# Patient Record
Sex: Female | Born: 1975 | Race: White | Hispanic: No | Marital: Married | Smoking: Never smoker
Health system: Southern US, Community
[De-identification: ages and names within clinical notes are randomized; demographics above are authoritative.]

## PROBLEM LIST (undated history)

## (undated) DIAGNOSIS — B001 Herpesviral vesicular dermatitis: Secondary | ICD-10-CM

## (undated) DIAGNOSIS — E669 Obesity, unspecified: Secondary | ICD-10-CM

## (undated) DIAGNOSIS — Z9889 Other specified postprocedural states: Secondary | ICD-10-CM

## (undated) DIAGNOSIS — E785 Hyperlipidemia, unspecified: Secondary | ICD-10-CM

## (undated) DIAGNOSIS — N393 Stress incontinence (female) (male): Secondary | ICD-10-CM

## (undated) DIAGNOSIS — R112 Nausea with vomiting, unspecified: Secondary | ICD-10-CM

## (undated) DIAGNOSIS — N920 Excessive and frequent menstruation with regular cycle: Secondary | ICD-10-CM

## (undated) HISTORY — PX: GASTROPLASTY DUODENAL SWITCH: SHX1699

## (undated) HISTORY — DX: Herpesviral vesicular dermatitis: B00.1

## (undated) HISTORY — DX: Obesity, unspecified: E66.9

## (undated) HISTORY — DX: Hyperlipidemia, unspecified: E78.5

---
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
## 1997-03-17 HISTORY — PX: LAPAROSCOPIC CHOLECYSTECTOMY: SUR755

## 2000-08-11 ENCOUNTER — Other Ambulatory Visit: Admission: RE | Admit: 2000-08-11 | Discharge: 2000-08-11 | Payer: Self-pay | Admitting: Obstetrics and Gynecology

## 2001-08-19 ENCOUNTER — Other Ambulatory Visit: Admission: RE | Admit: 2001-08-19 | Discharge: 2001-08-19 | Payer: Self-pay | Admitting: Obstetrics and Gynecology

## 2002-06-30 ENCOUNTER — Encounter: Payer: Self-pay | Admitting: Family Medicine

## 2002-06-30 ENCOUNTER — Ambulatory Visit (HOSPITAL_COMMUNITY): Admission: RE | Admit: 2002-06-30 | Discharge: 2002-06-30 | Payer: Self-pay | Admitting: Family Medicine

## 2002-08-30 ENCOUNTER — Other Ambulatory Visit: Admission: RE | Admit: 2002-08-30 | Discharge: 2002-08-30 | Payer: Self-pay | Admitting: Obstetrics and Gynecology

## 2002-11-04 ENCOUNTER — Ambulatory Visit (HOSPITAL_COMMUNITY): Admission: RE | Admit: 2002-11-04 | Discharge: 2002-11-04 | Payer: Self-pay | Admitting: Obstetrics and Gynecology

## 2002-11-04 ENCOUNTER — Encounter (INDEPENDENT_AMBULATORY_CARE_PROVIDER_SITE_OTHER): Payer: Self-pay

## 2002-11-04 HISTORY — PX: OTHER SURGICAL HISTORY: SHX169

## 2003-10-10 ENCOUNTER — Other Ambulatory Visit: Admission: RE | Admit: 2003-10-10 | Discharge: 2003-10-10 | Payer: Self-pay | Admitting: Obstetrics and Gynecology

## 2004-03-17 HISTORY — PX: TUBAL LIGATION: SHX77

## 2004-09-19 ENCOUNTER — Other Ambulatory Visit: Admission: RE | Admit: 2004-09-19 | Discharge: 2004-09-19 | Payer: Self-pay | Admitting: Obstetrics and Gynecology

## 2004-11-22 ENCOUNTER — Inpatient Hospital Stay (HOSPITAL_COMMUNITY): Admission: AD | Admit: 2004-11-22 | Discharge: 2004-11-22 | Payer: Self-pay | Admitting: Obstetrics and Gynecology

## 2004-12-13 ENCOUNTER — Inpatient Hospital Stay (HOSPITAL_COMMUNITY): Admission: AD | Admit: 2004-12-13 | Discharge: 2004-12-13 | Payer: Self-pay | Admitting: Obstetrics & Gynecology

## 2005-01-28 ENCOUNTER — Inpatient Hospital Stay (HOSPITAL_COMMUNITY): Admission: AD | Admit: 2005-01-28 | Discharge: 2005-02-05 | Payer: Self-pay | Admitting: Obstetrics and Gynecology

## 2005-02-18 ENCOUNTER — Ambulatory Visit: Payer: Self-pay | Admitting: *Deleted

## 2005-02-25 ENCOUNTER — Ambulatory Visit: Payer: Self-pay | Admitting: *Deleted

## 2005-03-04 ENCOUNTER — Ambulatory Visit: Payer: Self-pay | Admitting: *Deleted

## 2005-03-04 ENCOUNTER — Ambulatory Visit (HOSPITAL_COMMUNITY): Admission: RE | Admit: 2005-03-04 | Discharge: 2005-03-04 | Payer: Self-pay | Admitting: Obstetrics and Gynecology

## 2005-03-08 ENCOUNTER — Inpatient Hospital Stay (HOSPITAL_COMMUNITY): Admission: AD | Admit: 2005-03-08 | Discharge: 2005-03-13 | Payer: Self-pay | Admitting: Obstetrics and Gynecology

## 2005-03-09 ENCOUNTER — Encounter (INDEPENDENT_AMBULATORY_CARE_PROVIDER_SITE_OTHER): Payer: Self-pay | Admitting: Specialist

## 2005-04-24 ENCOUNTER — Other Ambulatory Visit: Admission: RE | Admit: 2005-04-24 | Discharge: 2005-04-24 | Payer: Self-pay | Admitting: Obstetrics and Gynecology

## 2008-12-05 ENCOUNTER — Emergency Department (HOSPITAL_COMMUNITY): Admission: EM | Admit: 2008-12-05 | Discharge: 2008-12-05 | Payer: Self-pay | Admitting: Emergency Medicine

## 2010-08-02 NOTE — Discharge Summary (Signed)
NAMEALNISA, [name]              ACCOUNT NO.:  [id number]   MEDICAL RECORD NO.:  [id number]          PATIENT TYPE:  INP   LOCATION:  9319                          FACILITY:  [facility]   PHYSICIAN:  Dineen Kid. Rana Snare, M.D.    DATE OF BIRTH:  1975-07-17   DATE OF ADMISSION:  03/08/2005  DATE OF DISCHARGE:  03/13/2005                                 DISCHARGE SUMMARY   ADMITTING DIAGNOSIS:  1.  Intrauterine pregnancy at 33-1/2 weeks' estimated gestational age.  2.  Twin gestation with breech/vertex presentation.   DISCHARGE DIAGNOSES:  1.  Status post low transverse cesarean section.  2.  Viable twin infants female and female.  3.  Permanent sterilization.   PROCEDURE:  1.  Primary low transverse cesarean section.  2.  Bilateral tubal ligation.   REASON FOR ADMISSION:  Please see written H&P.   HOSPITAL COURSE:  The patient is a 35 year old gravida 5, para 3 that was  admitted to [facility] at 33-1/2 weeks' estimated  gestational age with twin gestation, currently on terbutaline pump.  The  patient had presented to [facility] with increasing uterine  contractions.  Upon cervical exam, the cervix was dilated to 3 cm, 75%  effaced with twin A in the breech presentation.  The patient was contracting  regularly and was now admitted for management of preterm labor.  The patient  had been in the hospital previously and had received betamethasone.  On  admission, vital signs were stable.  Fetal heart tones were in the 150s  which were reactive.  Contractions were approximately every 2-4 minutes  without deceleration.  Due to a history of group B beta strep, IV  antibiotics were also administered and magnesium sulfate.  On the following  morning the patient had sustained spontaneous rupture of membranes on twin  A.  Fluid was noted to be clear. Cervix was now 5 cm.  The fetal heart tones  were in the 150s.  Decision was made to proceed with a primary low  transverse cesarean section.  The patient was then transferred to the  operating room where a spinal anesthesia was administered without  difficulty.  A low transverse incision was made with the delivery of a  viable twin A female infant, weighing 4 pounds 7 ounces and Apgars of 8 at one  minute and 9 at five minutes.  Arterial cord pH was 7.33.  Twin B female  weighing 4 pounds 1 ounce with Apgars of 9 at one minute and 9 at five  minutes.  Arterial cord pH was 7.33.  Bilateral tubal ligation was performed  without difficulty.  The patient tolerated the procedure well and was taken  to the recovery room in stable condition.  On postoperative day #1, the  patient was without complaint.  Vital signs were stable.  Babies was doing  well in the NICU.  Vital signs were stable.  She was afebrile.  The  abdominal dressing was noted to be clean, dry and intact.  Laboratory  findings revealed hemoglobin of 9.1.  On postoperative day #2, the patient  was without complaint.  She was tolerating a regular diet without complaints  of nausea or vomiting.  She was ambulating well.  Vital signs remained  stable.  She was afebrile.  Abdominal dressing had been removed revealing an  incision that is clean, dry and intact.  On postoperative day #3, the  patient was without complaint.  Vital signs were stable.  Abdomen soft.  Fundus firm and nontender.  Incision was clean, dry and intact. On  postoperative day #4, the patient was without complaint.  Babies remained  stable in the NICU.  Both were on room air with gavage feedings.  Abdomen  soft.  Fundus firm and nontender.  Incision was clean, dry and intact.  Subsequently, the patient was discharged home.   CONDITION ON DISCHARGE:  Good.   DIET:  Regular as tolerated.   ACTIVITY:  No heavy lifting, no driving x2 weeks, no vaginal entry.   FOLLOWUP:  The patient is to follow up in the office in 7-10 days for an  incision check.  She is to call for  temperature greater than 100 degrees,  persistent nausea and vomiting, heavy vaginal bleeding and/or redness or  drainage from incisional site.   DISCHARGE MEDICATIONS:  1.  Percocet 5/325, #30, on p.o. q.4-6h. p.r.n.  2.  Motrin 600 mg every 6 hours p.r.n.  3.  Valtrex 500 mg one p.o. daily.  4.  Prenatal vitamins one p.o. daily.      Julio Sicks, N.P.      Dineen Kid Rana Snare, M.D.  Electronically Signed    CC/MEDQ  D:  04/07/2005  T:  04/08/2005  Job:  [id number]

## 2010-08-02 NOTE — Discharge Summary (Signed)
[name], [name]              ACCOUNT NO.:  [id number]   MEDICAL RECORD NO.:  [id number]          PATIENT TYPE:  INP   LOCATION:  9162                          FACILITY:  [facility]   PHYSICIAN:  Michelle L. Grewal, M.D.DATE OF BIRTH:  [birth year]   DATE OF ADMISSION:  01/28/2005  DATE OF DISCHARGE:  02/05/2005                                 DISCHARGE SUMMARY   ADMITTING DIAGNOSES:  1.  Intrauterine pregnancy at 28-1/7 weeks' estimated gestational age.  2.  Twin gestation.  3.  Preterm labor.   DISCHARGE DIAGNOSES:  1.  Intrauterine pregnancy at 29-1/7 weeks; estimated gestational age.  2.  Preterm labor, tocolysis.   REASON FOR ADMISSION:  Please see written H&P.   HOSPITAL COURSE:  The patient is 35 year old gravida 5, para 3 that was  admitted to [facility] at 281/7 weeks with a twin gestation  who was sent to admission to triage for complaints of decreased fetal  movement.  Upon monitoring, the patient was noted to have contractions and  cervical change to 1 cm, 50% effaced with vertex at -3 station. The patient  was admitted for tocolysis of her labor.  The patient did have a history of  group B beta strep with a previous pregnancy, and IV antibiotics were  started.  Ultrasound was performed which revealed vertex and transverse lie.  Biophysical profile was 8/8  x2, and normal amniotic fluid index was noted.  Vital signs were stable.  She was afebrile.  Fetal heart tones were in the  140-150s, which were reactive.  The patient was admitted and started on  magnesium sulfate for tocolysis of her labor.  Betamethasone was  administered for enhancement of fetal lung maturity, and IV antibiotics were  started for history of positive group B beta strep.  On the following  morning, the patient continued on magnesium sulfate and IV antibiotics.  Fetal heart tones were reactive in the 140s.  The patient was now noted to  be without contractions.  The patient was  weaned from the magnesium sulfate  and was started on terbutaline.  _____ was contacted for precertification  for subcu terbutaline pump.  On the following day, the patient was without  complaint.  Fetal heart tones were within normal limits.  Uterine  contractions were noted to be irregular.  The patient was now on terbutaline  every 6 hours, and decision due to increasing uterine irritability was to  increase the p.o. terbutaline to 2.5 every 2-6 hours.  On the following  morning, the patient did noted that there was some increased contraction  pattern throughout the night.  Fetal heart tones continued to be reactive.  Abdomen soft.  Cervix was dilated 2 cm, 40%, soft with small parts palpable  through the membranes. The  patient continued on bedrest again awaiting for  the terbutaline pump.  The patient continued on oral terbutaline with  questionable restarting of the magnesium sulfate.  After several more days  without observation for terbutaline pump, the patient was now restarted on  magnesium sulfate.  Bag of waters continued to be intact.  A contraction  pattern did arrest with the usage of magnesium sulfate.  On February 05, 2005, the patient was without complaint.  We will continue to wait on the  terbutaline pump.  Vital signs were stable.  She was afebrile.  Fetal heart  tones were reactive both twin A and B.  No contractions were noted.  The  patient was later discharged that day to home on bedrest on oral  terbutaline, 5 mg every 4 hours   CONDITION ON DISCHARGE:  Stable.   DIET:  Regular as tolerated.   ACTIVITY:  Bed rest with bathroom privileges only.   FOLLOWUP:  The patient is to follow up in the office in one week for fetal  monitoring and cervical exam.  She is to call for menstrual-like cramping,  increasing in pelvic pressure, spontaneous rupture of membranes, low dull  backache unrelieved by heat or Tylenol or decrease in fetal movement.   DISCHARGE  MEDICATIONS:  1.  Terbutaline 5 mg one p.o. every 4 hours.  2.  Prenatal vitamins one p.o. daily.  3.  Colace one p.o. daily p.r.n.      Julio Sicks, N.P.      Stann Mainland. Vincente Poli, M.D.  Electronically Signed    CC/MEDQ  D:  04/07/2005  T:  04/08/2005  Job:  [id number]

## 2010-08-02 NOTE — H&P (Signed)
NAME:  Brandy, Mitchell                          ACCOUNT NO.:  [id number]   MEDICAL RECORD NO.:  [id number]                   PATIENT TYPE:   LOCATION:                                       FACILITY:   PHYSICIAN:  Guy Sandifer. Arleta Creek, M.D.           DATE OF BIRTH:   DATE OF ADMISSION:  11/04/2002  DATE OF DISCHARGE:                                HISTORY & PHYSICAL   CHIEF COMPLAINT:  Heavy painful menses.   HISTORY OF PRESENT ILLNESS:  This patient is a 35 year old married white  female, G4, P3, Ab 1, on the Ortho-Evra patch who has had significantly  increased  premenstrual cramping. The pain has progressed to the point that  the patient requests further evaluation in view of the failure of hormonal  contraceptives to adequately relieve her pain. She is also having heavier  menses each month.   An ultrasound in my office on September 06, 2002, revealed a uterus measuring  8.63 x 3.90 x 5.35 cm. A sonohistogram was positive for a thickening of the  posterior wall of the endometrial cavity, possibly consistent with an  endometrial polyp. A laparoscopy hysteroscopy with resectoscope dilatation  and curettage has been discussed with the patient preoperatively. The  potential risks and complications have also been reviewed preoperatively.   PAST MEDICAL HISTORY:  Stress urinary incontinence.   PAST SURGICAL HISTORY:  1. Laparoscopic cholecystectomy in 1999.  2. Dilatation and curettage in 1995.   MEDICATIONS:  1. Zithromax.  2. Lucienne Minks.   ALLERGIES:  No known drug allergies.   SOCIAL HISTORY:  The patient denies tobacco, alcohol or drug abuse.   FAMILY HISTORY:  Positive for breast cancer in maternal grandmother.  Prostate cancer in paternal grandfather. Chronic  hypertension in mother.  Alzheimer's disease in maternal great grandmother.   REVIEW OF SYSTEMS:  NEUROLOGIC:  Denies  headache. PULMONARY: The patient  was recently treated for  sinusitis which is getting better on  Zithromax.  CARDIAC:  The patient denies chest pain. GI: Denies changes in bowel habits.   PHYSICAL EXAMINATION:  GENERAL:  Height 4 feet, 9 inches. Weight 178-1/2  pounds.  VITAL SIGNS:  Blood pressure 116/76.  HEENT:  Without thyromegaly.  LUNGS:  Clear to auscultation.  HEART:  Regular rate and rhythm.  BACK:  No CVA tenderness.  BREASTS:  No masses, tract or discharge.  ABDOMEN:  Obese, soft, nontender without masses.  PELVIC:  Vulva, vagina, cervix without lesions. Uterus is normal in size and  nontender. Adnexa nontender  without masses.  EXTREMITIES:  Grossly  within normal limits.  NEUROLOGIC:  Grossly  within normal limits.   ASSESSMENT:  Dysmenorrhea and menorrhagia.   PLAN:  Laparoscopy, hysteroscopy with dilatation and curettage.  Guy Sandifer Arleta Creek, M.D.    JET/MEDQ  D:  11/01/2002  T:  11/01/2002  Job:  [id number]

## 2010-08-02 NOTE — Op Note (Signed)
[name], [name]              ACCOUNT NO.:  [id number]   MEDICAL RECORD NO.:  [id number]           PATIENT TYPE:   LOCATION:                                 FACILITY:   PHYSICIAN:  Juluis Mire, M.D.        DATE OF BIRTH:   DATE OF PROCEDURE:  DATE OF DISCHARGE:                                 OPERATIVE REPORT   PREOPERATIVE DIAGNOSES:  1.  Twin pregnancy at 33-1/2 weeks.  2.  Breech vertex presentation.  3.  Spontaneous rupture of membranes with progressive cervical dilatation.  4.  Multiparity. Desires sterility.   POSTOPERATIVE DIAGNOSES:  1.  Twin pregnancy at 33-1/2 weeks.  2.  Breech vertex presentation.  3.  Spontaneous rupture of membranes with progressive cervical dilatation.  4.  Multiparity. Desires sterility.   OPERATION/PROCEDURE:  1.  Low transverse cesarean section.  2.  Bilateral tubal ligation.   SURGEON:  Juluis Mire, M.D.   ANESTHESIA:  Spinal.   ESTIMATED BLOOD LOSS:  500-600 mL.   PACKS AND DRAINS:  None.   INTRAOPERATIVE BLOOD REPLACED:  None.   COMPLICATIONS:  None.   INDICATIONS:  The patient is a 35 year old female who is at 33-1/2 weeks  with twin pregnancy.  She presented with progressive contractions despite a  terbutaline pump.  Was subsequently placed on IV magnesium sulfate with  stabilization of the contraction pattern.  She had previously been given a  bag of Methasone.  She was placed on Unasyn.  Cervix was noted to be 3, 75%  effaced. She subsequently had spontaneous rupture of membranes with  progressive cervical dilatation.  Twin A was in a breech presentation.  The  decision was to proceed with primary cesarean section.  The risks were  discussed including risk of infection, risk of the hemorrhage that could  require transfusion with the risk of AIDS or hepatitis, risk of injury to  adjacent organs including bladder, bowel and ureters which could require  further exploratory surgery, the risk of deep venous thrombosis  and  pulmonary embolus.  She also desires a permanent sterilization.  The  potential reversibility of sterilization is explained.  The failure rate of  1 in 200 was quoted.  Failure can in the form of ectopic pregnancy requiring  further surgical management.   DESCRIPTION OF PROCEDURE:  The patient was taken to the OR and placed in the  supine position, left lateral tilt.  After satisfactory level of spinal  anesthesia was obtained, the abdomen was prepped out with Betadine and  draped with sterile field.  A low transverse skin incision was with the  knife and carried through the subcutaneous tissue.  Anterior rectus fascia  was entered sharply.  Incision in the fascia was extended laterally.  Fascia  taken off the muscles superiorly and inferiorly.  Rectus muscles were  separated in the midline.  Peritoneum was entered sharply and incision in  the peritoneum extended both superiorly and inferiorly.  A low transverse  bladder flap was developed.  A low transverse uterine incision begun with  the knife and extended laterally  using manual traction.  Twin A presented in  the breech presentation.  It was delivered in the usual manner.  Twin A was  a viable female who weight 4 pounds 7 ounces.  Apgars 8 and 9.  Umbilical cord  PH was 7.33.  Subsequently twin B was delivered after artificial rupture of  membranes.  Fluid was clear.  Infant was in the vertex presentation.  Delivered with elevation of the head and fundal pressure.  The infant was a  viable female weighing 4 pounds 1 ounce.  Apgars were 8 and 9.  Umbilicus  artery PH was also 7.33.  Placenta was delivered manually.  Uterus was wiped  free of membranes and placenta.  Uterus was then closed with interlocking  suture of 0 chromic using a two-layer closure technique, brought under  control with figure-of-eights of 0 chromic.   Both tubes were identified and elevated with the Babcock tenaculum.  A hole  was made in avascular area of the  mesosalpinx.  Individual ligatures of 0  plain catgut used to ligate off the segment of tube.  The intervening  segment of tube was then excised.  Cut end of the tube was then cauterized  with good hemostasis bilaterally.  Ovaries were unremarkable.   Uterus was returned to the abdominal cavity.  We thoroughly irrigated.  Had  good hemostasis and clear urine output.  Muscles reapproximated with running  suture of 3-0 Vicryl.  Fascia was closed with running suture of 0 PDS.  Skin  was closed with staples and Steri-Strips.  Sponge, instrument and needle  counts were reported as correct by the circulating nurse x2.  Foley catheter  remained clear at time of closure.  The patient tolerated the procedure well  and was returned to the recovery room in good condition.      Juluis Mire, M.D.  Electronically Signed     JSM/MEDQ  D:  03/09/2005  T:  03/10/2005  Job:  [id number]

## 2010-08-02 NOTE — Op Note (Signed)
[name], [name]                        ACCOUNT NO.:  [id number]   MEDICAL RECORD NO.:  [id number]                   PATIENT TYPE:  AMB   LOCATION:  SDC                                  FACILITY:  [facility]   PHYSICIAN:  Guy Sandifer. Arleta Creek, M.D.           DATE OF BIRTH:  Oct 29, 1975   DATE OF PROCEDURE:  11/04/2002  DATE OF DISCHARGE:                                 OPERATIVE REPORT   PREOPERATIVE DIAGNOSIS:  1. Menorrhagia.  2. Dysmenorrhea.   POSTOPERATIVE DIAGNOSIS:  1. Menorrhagia.  2. Pelvic adhesions.   PROCEDURE:  Laparoscopy with lysis of adhesions and hysteroscopy with  dilation and curettage.   SURGEON:  Guy Sandifer. Henderson Cloud, M.D.   ANESTHESIA:  General with endotracheal intubation, Cristela Blue, M.D.   ESTIMATED BLOOD LOSS:  Less than 50 mL.   INPUTS AND OUTPUTS:  Sorbitol distending media, 30 mL deficit.   SPECIMENS:  Endometrial curettings.   INDICATIONS AND CONSENT:  The patient is a 35 year old married white female,  G4, P3, who has increasing premenstrual cramping as well as heavy vaginal  bleeding.  Details are dictated in the history and physical.  Hysteroscopy  with possible resectoscope, dilation and curettage, laparoscopy has been  discussed with the patient.  The potential risks and complications have been  discussed preoperatively including but not limited to infection, uterine  perforation, bowel, bladder or ureteral damage, bleeding requiring  transfusion of blood products with possible transfusion reaction, HIV,  hepatitis acquisition, DVT, PE, pneumonia, laparotomy and hysterectomy.  All  questions have been answered and consent is signed on the chart.   FINDINGS:  Endometrial canal is without abnormal structure.  Fallopian tube  ostia are identified bilaterally.  The upper abdomen is grossly normal.  The  appendix is normal.  The uterus is upper limits of normal in size and normal  in contour.  Anterior and posterior cul-de-sacs and pelvic  sidewalls are  normal.  Tubes and ovaries are normal bilaterally.  The left ovary is  adherent to the sigmoid with filmy adhesions at its distal pole.   PROCEDURE:  The patient was taken to the operating room and placed in the  dorsal supine position where general anesthesia was induced with  endotracheal intubation.  She was then placed in the dorsal lithotomy  position where she was prepped abdominally and vaginally.  Her bladder was  straight catheterized and she is draped in a sterile fashion.  A bivalve  speculum was placed in the vagina, the anterior cervical lip is injected  with 1% Xylocaine and grasped with a single tooth tenaculum.  A paracervical  block was then placed at the 2, 4, 5, 7, 8, and 10 o'clock positions with  approximately 20 mL total 1% Xylocaine.  The cervix is gently progressively  dilated to 25 Pratt dilator.  The diagnostic hysteroscope is placed and  advanced under direct visualization using Sorbitol distending media.  The  findings are noted, the hysteroscope was withdrawn.  Sharp curettage was  carried out.  Reinspection with the hysteroscope reveals the cavity to be  clean.  The single tooth tenaculum is replaced with a  Hulka tenaculum and  attention was turned to the abdomen.  A small infraumbilical incision was  made and a 10/11 disposable trocar sleeve was placed on the first attempt  without difficulty.  Placement was verified with the laparoscope and careful  inspection reveals no damage to the surrounding structures was noted.  Pneumoperitoneum was induced.  A small suprapubic incision was made and a 5  mm disposable trocar sleeve was placed under direct visualization without  difficulty.  The above findings were noted.  The adhesions to the left ovary  are taken down sharply.  The suprapubic trocar sleeve was removed,  pneumoperitoneum was reduced, and good hemostasis was noted all around.  The  pneumoperitoneum is completely reduced.  Umbilical  trocar sleeve is removed.  A 2-0 Vicryl suture was used to close the subcutaneous tissues in the  umbilical incision.  Both incisions were injected with 0.5% plain Marcaine.  Dermabond was used to close the skin incisions.  The Hulka tenaculum was  removed and good hemostasis was noted.  All counts were correct.  The  patient was awakened and taken to the recovery room in stable condition.  She did receive preoperative antibiotics.                                               Guy Sandifer Arleta Creek, M.D.    JET/MEDQ  D:  11/04/2002  T:  11/04/2002  Job:  [id number]

## 2012-07-20 ENCOUNTER — Telehealth: Payer: Self-pay | Admitting: Family Medicine

## 2012-07-20 NOTE — Telephone Encounter (Signed)
Need shot records for children, [name], and [name] for new school they are going to.  Need by end of week.

## 2012-07-20 NOTE — Telephone Encounter (Signed)
Mother to call Dr. Webb Laws office to get shot record.  Never sent to [place] by Halm's office and not in Epic. BB

## 2013-02-02 ENCOUNTER — Ambulatory Visit (HOSPITAL_COMMUNITY): Admission: RE | Admit: 2013-02-02 | Payer: 59 | Source: Ambulatory Visit

## 2013-02-02 ENCOUNTER — Ambulatory Visit (INDEPENDENT_AMBULATORY_CARE_PROVIDER_SITE_OTHER): Payer: 59 | Admitting: Nurse Practitioner

## 2013-02-02 ENCOUNTER — Ambulatory Visit (HOSPITAL_COMMUNITY)
Admission: RE | Admit: 2013-02-02 | Discharge: 2013-02-02 | Disposition: A | Discharge status: Home / Self Care | Payer: 59 | Source: Ambulatory Visit | Attending: Nurse Practitioner | Admitting: Nurse Practitioner

## 2013-02-02 ENCOUNTER — Encounter: Payer: Self-pay | Admitting: Nurse Practitioner

## 2013-02-02 VITALS — BP 110/68 | Ht <= 58 in | Wt 234.0 lb

## 2013-02-02 DIAGNOSIS — M799 Soft tissue disorder, unspecified: Secondary | ICD-10-CM | POA: Insufficient documentation

## 2013-02-02 DIAGNOSIS — M25569 Pain in unspecified knee: Secondary | ICD-10-CM

## 2013-02-02 DIAGNOSIS — M25562 Pain in left knee: Secondary | ICD-10-CM

## 2013-02-02 MED ORDER — CEPHALEXIN 500 MG PO CAPS: 500.0000 mg | ORAL_CAPSULE | Freq: Three times a day (TID) | ORAL | Status: AC

## 2013-02-03 ENCOUNTER — Encounter: Payer: Self-pay | Admitting: Nurse Practitioner

## 2013-02-03 ENCOUNTER — Telehealth: Payer: Self-pay | Admitting: Nurse Practitioner

## 2013-02-03 ENCOUNTER — Other Ambulatory Visit: Payer: Self-pay | Admitting: *Deleted

## 2013-02-03 NOTE — Progress Notes (Signed)
Spoke with patient and told her US results: no clot; possible bruise ( hematoma) or infection. D dimer normal. As a precaution, will call in 7 days of antibiotics. Warm compresses. NSAIDs for pain. Call back if worsens or persists. Pt verbalized understanding. 

## 2013-02-03 NOTE — Progress Notes (Signed)
Subjective:  Presents with complaints of a bump in the left pretibial area for the past few days. Slight swelling in the lower leg at times. No specific history of injury. No fever. No chest pain shortness of breath. [facility] her mother has had both legs removed due to blood clots, note she is a smoker. No other family members have a history of DVT. Patient is not a smoker.  Objective:   BP 110/68  Ht 4\' 9"  (1.448 m)  Wt 234 lb (106.142 kg)  BMI 50.62 kg/m2 NAD. Alert, oriented. Lungs clear. Heart regular rate rhythm. A firm fixed nodule noted in the mid left pretibial area with minimal discoloration. Faint erythema noted around this. Noted at the tip of a previous scar. Lower extremity minimal edema. Strong DP pulse. Toes warm with good capillary refill.  Assessment:Pain in joint, lower leg, left - Plan: D-dimer, quantitative, [place] Extrem Low Left Ltd, [place] Extrem Low Left Ltd, [place] Extrem Low Left Ltd, CANCELED: US Venous Img Lower Unilateral Left  Plan: Further followup based on test results. Warm compresses. Continue ibuprofen for pain.

## 2013-02-03 NOTE — Telephone Encounter (Signed)
Spoke with patient and told her [place] results: no clot; possible bruise ( hematoma) or infection. D dimer normal. As a precaution, will call in 7 days of antibiotics. Warm compresses. NSAIDs for pain. Call back if worsens or persists. Pt verbalized understanding.

## 2013-02-03 NOTE — Telephone Encounter (Signed)
Patient called to inquire about Test Results

## 2014-01-04 ENCOUNTER — Other Ambulatory Visit: Payer: Self-pay | Admitting: Nurse Practitioner

## 2014-01-04 ENCOUNTER — Telehealth: Payer: Self-pay | Admitting: Family Medicine

## 2014-01-04 MED ORDER — IVERMECTIN 0.5 % EX LOTN: TOPICAL_LOTION | CUTANEOUS | Status: DC

## 2014-01-04 NOTE — Telephone Encounter (Signed)
Patient notified

## 2014-01-04 NOTE — Telephone Encounter (Signed)
Yes will call in [name]. Also tea tree oil is very effective and also once resolved, add a few drops to shampoo during bathing to help prevent reinfection.

## 2014-01-04 NOTE — Telephone Encounter (Signed)
Mom called yesterday to get med for girls whom acquired lice from school Now mom has an an pretty sure dad does too, can we call something in for both  Of them or for the rest of the family. (which is a total of 5 people)   [place]

## 2014-03-22 ENCOUNTER — Ambulatory Visit (INDEPENDENT_AMBULATORY_CARE_PROVIDER_SITE_OTHER): Payer: 59 | Admitting: Family Medicine

## 2014-03-22 VITALS — Temp 98.0°F

## 2014-03-22 DIAGNOSIS — B9689 Other specified bacterial agents as the cause of diseases classified elsewhere: Secondary | ICD-10-CM

## 2014-03-22 DIAGNOSIS — J019 Acute sinusitis, unspecified: Secondary | ICD-10-CM

## 2014-03-22 MED ORDER — LEVOFLOXACIN 500 MG PO TABS: 500.0000 mg | ORAL_TABLET | Freq: Every day | ORAL | Status: DC

## 2014-03-22 NOTE — Patient Instructions (Signed)
Schedule nurse visit for both ears to be irrigated

## 2014-03-22 NOTE — Progress Notes (Signed)
   Subjective:    Patient ID: [name], female    DOB: Nov 29, 1975, 39 y.o.   MRN: [id number]  HPI Patient relates she started off with a viral illness then several days later started having a lot of sinus pressure pain discomfort teeth aching not feeling good low-grade fevers denies any neck stiffness denies any wheezing or difficulty breathing. PMH benign family member sick as well   Review of Systems  Constitutional: Negative for fever and activity change.  HENT: Positive for congestion and rhinorrhea. Negative for ear pain.   Eyes: Negative for discharge.  Respiratory: Positive for cough. Negative for shortness of breath and wheezing.   Cardiovascular: Negative for chest pain.       Objective:   Physical Exam  Constitutional: She appears well-developed.  HENT:  Head: Normocephalic.  Nose: Nose normal.  Mouth/Throat: Oropharynx is clear and moist. No oropharyngeal exudate.  Neck: Neck supple.  Cardiovascular: Normal rate and normal heart sounds.   No murmur heard. Pulmonary/Chest: Effort normal and breath sounds normal. She has no wheezes.  Lymphadenopathy:    She has no cervical adenopathy.  Skin: Skin is warm and dry.  Nursing note and vitals reviewed.         Assessment & Plan:  Viral syndrome Acute bacterial sinusitis Antibiotics prescribed Warning signs discussed.

## 2014-04-17 ENCOUNTER — Telehealth: Payer: Self-pay | Admitting: Family Medicine

## 2014-04-17 MED ORDER — SULFACETAMIDE SODIUM 10 % OP SOLN: OPHTHALMIC | Status: DC

## 2014-04-17 NOTE — Telephone Encounter (Signed)
Sulfacetamide drops sent to pharmacy per protocol. Left message on voicemail notifying patient.

## 2014-04-17 NOTE — Telephone Encounter (Signed)
Pt is having redness to the eyes, gritty feeling, itchy   No sulfa allergies,   [facility] [facility]

## 2014-06-29 ENCOUNTER — Other Ambulatory Visit: Payer: Self-pay | Admitting: Obstetrics and Gynecology

## 2014-06-30 LAB — CYTOLOGY - PAP

## 2014-08-16 ENCOUNTER — Encounter (HOSPITAL_BASED_OUTPATIENT_CLINIC_OR_DEPARTMENT_OTHER): Payer: Self-pay | Admitting: *Deleted

## 2014-08-16 NOTE — Progress Notes (Signed)
NPO AFTER MN.  ARRIVE AT 0930.  NEEDS CBC AND T & S.

## 2014-08-22 NOTE — H&P (Signed)
[name] is an 39 y.o. female G5P5 S/P BTL with GSUI and menorrhagia. U/S negative for intracavitary uterine masses. 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 Gyn Oncology recommends fractional D&C. Also urodynamics C/W GSUI that is clinically becoming worse.  Pertinent Gynecological History: Menses: flow is excessive with use of many pads or tampons on heaviest days Bleeding: dysfunctional uterine bleeding Contraception: tubal ligation DES exposure: denies Blood transfusions: none Sexually transmitted diseases: no past history Previous GYN Procedures: BTL  Last mammogram: none Date: N/A Last pap: normal Date: 2016 OB History: G5, P5   Menstrual History: Menarche age: unknown  Patient's last menstrual period was 07/16/2014 (exact date).    Past Medical History  Diagnosis Date  . PONV (postoperative nausea and vomiting)   . SUI (stress urinary incontinence, female)   . Menorrhagia     Past Surgical History  Procedure Laterality Date  . Dilation and curettage of uterus  1995  . Laparoscopic cholecystectomy  1999  . Dx  laparosocpy with lysis adhesions/  d & c hysteroscopy  11-04-2002  . Cesarean section  03-09-2005    W/  BILATERAL TUBAL LIGATION    History reviewed. No pertinent family history.  Social History:  reports that she has never smoked. She has never used smokeless tobacco. She reports that she does not drink alcohol or use illicit drugs.  Allergies: Not on File  No prescriptions prior to admission    ROS  Height 4' 10.25" (1.48 m), weight 223 lb (101.152 kg), last menstrual period 07/16/2014. Physical Exam  No results found for this or any previous visit (from the past 24 hour(s)).  No results found.  Assessment/Plan: 39 yo G5P5 S/P BTL with menorrhagia and EMB showing squamous morular metaplasia and GSUI D/W patient fractional D&C, hysteroscopy, TOT Risks reviewed including infection, organ damage,  bleeding/transfusion-HIV/Hep, DVT/PE, pneumonia, pelvic pain, painful intercourse, groin/leg pain, recurrent or persistent SUI, prolonged catheterization, return to OR, overactive bladder, erosion of sling.  All questions answered. Patient states she understands and agrees.  Demarkis Gheen II,Ramah Langhans E 08/22/2014, 4:23 PM

## 2014-08-23 ENCOUNTER — Ambulatory Visit (HOSPITAL_BASED_OUTPATIENT_CLINIC_OR_DEPARTMENT_OTHER): Payer: 59 | Admitting: Anesthesiology

## 2014-08-23 ENCOUNTER — Ambulatory Visit (HOSPITAL_BASED_OUTPATIENT_CLINIC_OR_DEPARTMENT_OTHER)
Admission: RE | Admit: 2014-08-23 | Discharge: 2014-08-23 | Disposition: A | Discharge status: Home / Self Care | Payer: 59 | Source: Ambulatory Visit | Attending: Obstetrics and Gynecology | Admitting: Obstetrics and Gynecology

## 2014-08-23 ENCOUNTER — Encounter (HOSPITAL_BASED_OUTPATIENT_CLINIC_OR_DEPARTMENT_OTHER): Payer: Self-pay

## 2014-08-23 ENCOUNTER — Encounter (HOSPITAL_BASED_OUTPATIENT_CLINIC_OR_DEPARTMENT_OTHER)
Admission: RE | Disposition: A | Discharge status: Home / Self Care | Payer: Self-pay | Source: Ambulatory Visit | Attending: Obstetrics and Gynecology

## 2014-08-23 DIAGNOSIS — Z9851 Tubal ligation status: Secondary | ICD-10-CM | POA: Diagnosis not present

## 2014-08-23 DIAGNOSIS — N920 Excessive and frequent menstruation with regular cycle: Secondary | ICD-10-CM | POA: Insufficient documentation

## 2014-08-23 DIAGNOSIS — N393 Stress incontinence (female) (male): Secondary | ICD-10-CM | POA: Diagnosis not present

## 2014-08-23 DIAGNOSIS — N841 Polyp of cervix uteri: Secondary | ICD-10-CM | POA: Diagnosis not present

## 2014-08-23 DIAGNOSIS — Z6841 Body Mass Index (BMI) 40.0 and over, adult: Secondary | ICD-10-CM | POA: Insufficient documentation

## 2014-08-23 DIAGNOSIS — Z9049 Acquired absence of other specified parts of digestive tract: Secondary | ICD-10-CM | POA: Diagnosis not present

## 2014-08-23 HISTORY — DX: Stress incontinence (female) (male): N39.3

## 2014-08-23 HISTORY — PX: HYSTEROSCOPY WITH D & C: SHX1775

## 2014-08-23 HISTORY — DX: Nausea with vomiting, unspecified: R11.2

## 2014-08-23 HISTORY — DX: Excessive and frequent menstruation with regular cycle: N92.0

## 2014-08-23 HISTORY — DX: Other specified postprocedural states: Z98.890

## 2014-08-23 HISTORY — PX: PUBOVAGINAL SLING: SHX1035

## 2014-08-23 LAB — CBC
HCT: 42 % (ref 36.0–46.0)
Hemoglobin: 14 g/dL (ref 12.0–15.0)
MCH: 29.3 pg (ref 26.0–34.0)
MCHC: 33.3 g/dL (ref 30.0–36.0)
MCV: 87.9 fL (ref 78.0–100.0)
PLATELETS: 283 10*3/uL (ref 150–400)
RBC: 4.78 MIL/uL (ref 3.87–5.11)
RDW: 12.5 % (ref 11.5–15.5)
WBC: 5.5 10*3/uL (ref 4.0–10.5)

## 2014-08-23 LAB — TYPE AND SCREEN
ABO/RH(D): O POS
Antibody Screen: NEGATIVE

## 2014-08-23 LAB — ABO/RH: ABO/RH(D): O POS

## 2014-08-23 SURGERY — DILATATION AND CURETTAGE /HYSTEROSCOPY
Anesthesia: General | Site: Vagina

## 2014-08-23 MED ORDER — BUPIVACAINE HCL (PF) 0.25 % IJ SOLN
INTRAMUSCULAR | Status: DC | PRN
  Administered 2014-08-23: 18 mL

## 2014-08-23 MED ORDER — PROPOFOL 10 MG/ML IV BOLUS
INTRAVENOUS | Status: DC | PRN
  Administered 2014-08-23: 200 mg via INTRAVENOUS

## 2014-08-23 MED ORDER — GLYCOPYRROLATE 0.2 MG/ML IJ SOLN
INTRAMUSCULAR | Status: DC | PRN
  Administered 2014-08-23: 0.2 mg via INTRAVENOUS

## 2014-08-23 MED ORDER — GLYCINE 1.5 % IR SOLN
Status: DC | PRN
  Administered 2014-08-23: 3000 mL

## 2014-08-23 MED ORDER — FENTANYL CITRATE (PF) 100 MCG/2ML IJ SOLN
25.0000 ug | INTRAMUSCULAR | Status: DC | PRN
  Administered 2014-08-23: 50 ug via INTRAVENOUS
  Filled 2014-08-23: qty 1

## 2014-08-23 MED ORDER — FENTANYL CITRATE (PF) 100 MCG/2ML IJ SOLN
INTRAMUSCULAR | Status: AC
  Filled 2014-08-23: qty 4

## 2014-08-23 MED ORDER — CEFAZOLIN SODIUM-DEXTROSE 2-3 GM-% IV SOLR
2.0000 g | INTRAVENOUS | Status: AC
  Administered 2014-08-23: 2 g via INTRAVENOUS
  Filled 2014-08-23: qty 50

## 2014-08-23 MED ORDER — STERILE WATER FOR IRRIGATION IR SOLN
Status: DC | PRN
  Administered 2014-08-23: 3000 mL

## 2014-08-23 MED ORDER — FENTANYL CITRATE (PF) 100 MCG/2ML IJ SOLN
INTRAMUSCULAR | Status: AC
  Filled 2014-08-23: qty 2

## 2014-08-23 MED ORDER — DEXAMETHASONE SODIUM PHOSPHATE 10 MG/ML IJ SOLN
INTRAMUSCULAR | Status: DC | PRN
  Administered 2014-08-23: 10 mg via INTRAVENOUS

## 2014-08-23 MED ORDER — KETOROLAC TROMETHAMINE 30 MG/ML IJ SOLN
INTRAMUSCULAR | Status: DC | PRN
  Administered 2014-08-23: 30 mg via INTRAVENOUS

## 2014-08-23 MED ORDER — CEFAZOLIN SODIUM-DEXTROSE 2-3 GM-% IV SOLR
INTRAVENOUS | Status: AC
  Filled 2014-08-23: qty 50

## 2014-08-23 MED ORDER — PROMETHAZINE HCL 25 MG/ML IJ SOLN
6.2500 mg | INTRAMUSCULAR | Status: DC | PRN
  Filled 2014-08-23: qty 1

## 2014-08-23 MED ORDER — MIDAZOLAM HCL 5 MG/5ML IJ SOLN
INTRAMUSCULAR | Status: DC | PRN
  Administered 2014-08-23 (×2): 1 mg via INTRAVENOUS

## 2014-08-23 MED ORDER — LIDOCAINE HCL (CARDIAC) 20 MG/ML IV SOLN
INTRAVENOUS | Status: DC | PRN
  Administered 2014-08-23: 100 mg via INTRAVENOUS

## 2014-08-23 MED ORDER — LACTATED RINGERS IV SOLN
INTRAVENOUS | Status: DC
  Administered 2014-08-23 (×3): via INTRAVENOUS
  Filled 2014-08-23: qty 1000

## 2014-08-23 MED ORDER — ONDANSETRON HCL 4 MG/2ML IJ SOLN
INTRAMUSCULAR | Status: DC | PRN
  Administered 2014-08-23: 4 mg via INTRAVENOUS

## 2014-08-23 MED ORDER — FENTANYL CITRATE (PF) 100 MCG/2ML IJ SOLN
INTRAMUSCULAR | Status: DC | PRN
  Administered 2014-08-23 (×8): 25 ug via INTRAVENOUS

## 2014-08-23 MED ORDER — MIDAZOLAM HCL 2 MG/2ML IJ SOLN
INTRAMUSCULAR | Status: AC
  Filled 2014-08-23: qty 4

## 2014-08-23 MED ORDER — LACTATED RINGERS IV SOLN
INTRAVENOUS | Status: DC
  Filled 2014-08-23: qty 1000

## 2014-08-23 MED ORDER — ACETAMINOPHEN 10 MG/ML IV SOLN
INTRAVENOUS | Status: DC | PRN
  Administered 2014-08-23: 1000 mg via INTRAVENOUS

## 2014-08-23 MED ORDER — MEPERIDINE HCL 25 MG/ML IJ SOLN
6.2500 mg | INTRAMUSCULAR | Status: DC | PRN
  Filled 2014-08-23: qty 1

## 2014-08-23 MED ORDER — METOCLOPRAMIDE HCL 5 MG/ML IJ SOLN
INTRAMUSCULAR | Status: DC | PRN
  Administered 2014-08-23: 10 mg via INTRAVENOUS

## 2014-08-23 SURGICAL SUPPLY — 56 items
BLADE SURG 11 STRL SS (BLADE) ×4 IMPLANT
BLADE SURG 15 STRL LF DISP TIS (BLADE) ×2 IMPLANT
BLADE SURG 15 STRL SS (BLADE) ×4
CATH ROBINSON RED A/P 16FR (CATHETERS) ×2 IMPLANT
COVER BACK TABLE 60X90IN (DRAPES) ×6 IMPLANT
COVER LIGHT HANDLE  1/PK (MISCELLANEOUS)
COVER LIGHT HANDLE 1/PK (MISCELLANEOUS) ×2 IMPLANT
COVER MAYO STAND STRL (DRAPES) ×4 IMPLANT
DECANTER SPIKE VIAL GLASS SM (MISCELLANEOUS) IMPLANT
DEVICE CAPIO SLIM SINGLE (INSTRUMENTS) IMPLANT
DEVICE MYOSURE CLASSIC (MISCELLANEOUS) IMPLANT
DEVICE MYOSURE LITE (MISCELLANEOUS) IMPLANT
DRAPE HYSTEROSCOPY (DRAPE) ×4 IMPLANT
DRAPE LG THREE QUARTER DISP (DRAPES) ×8 IMPLANT
DRSG TELFA 3X8 NADH (GAUZE/BANDAGES/DRESSINGS) ×4 IMPLANT
ELECT REM PT RETURN 9FT ADLT (ELECTROSURGICAL) ×4
ELECTRODE REM PT RTRN 9FT ADLT (ELECTROSURGICAL) ×2 IMPLANT
FILTER ARTHROSCOPY CONVERTOR (FILTER) ×2 IMPLANT
GLOVE BIO SURGEON STRL SZ 6.5 (GLOVE) ×1 IMPLANT
GLOVE BIO SURGEON STRL SZ7.5 (GLOVE) ×8 IMPLANT
GLOVE BIO SURGEONS STRL SZ 6.5 (GLOVE) ×1
GLOVE BIOGEL PI IND STRL 8 (GLOVE) ×4 IMPLANT
GLOVE BIOGEL PI INDICATOR 8 (GLOVE) ×4
GLOVE INDICATOR 6.5 STRL GRN (GLOVE) ×2 IMPLANT
GOWN STRL REUS W/TWL LRG LVL3 (GOWN DISPOSABLE) ×8 IMPLANT
LEGGING LITHOTOMY PAIR STRL (DRAPES) ×4 IMPLANT
LIQUID BAND (GAUZE/BANDAGES/DRESSINGS) ×4 IMPLANT
NDL SPNL 22GX3.5 QUINCKE BK (NEEDLE) ×2 IMPLANT
NEEDLE HYPO 22GX1.5 SAFETY (NEEDLE) IMPLANT
NEEDLE SPNL 22GX3.5 QUINCKE BK (NEEDLE) ×4 IMPLANT
NS IRRIG 500ML POUR BTL (IV SOLUTION) ×4 IMPLANT
PACK BASIN DAY SURGERY FS (CUSTOM PROCEDURE TRAY) ×4 IMPLANT
PAD DRESSING TELFA 3X8 NADH (GAUZE/BANDAGES/DRESSINGS) ×2 IMPLANT
PAD OB MATERNITY 4.3X12.25 (PERSONAL CARE ITEMS) ×4 IMPLANT
PAD PREP 24X48 CUFFED NSTRL (MISCELLANEOUS) ×4 IMPLANT
PENCIL BUTTON HOLSTER BLD 10FT (ELECTRODE) ×4 IMPLANT
PLUG CATH AND CAP STER (CATHETERS) ×4 IMPLANT
SEAL ROD LENS SCOPE MYOSURE (ABLATOR) ×2 IMPLANT
SET IRRIG Y TYPE TUR BLADDER L (SET/KITS/TRAYS/PACK) ×4 IMPLANT
SLING HALO OBTRYX (Sling) ×4 IMPLANT
SLING HALO OBTRYX NDL (Sling) IMPLANT
SUT CAPIO POLYGLYCOLIC (SUTURE) IMPLANT
SUT VIC AB 2-0 CT1 27 (SUTURE) ×4
SUT VIC AB 2-0 CT1 TAPERPNT 27 (SUTURE) ×2 IMPLANT
SYR BULB IRRIGATION 50ML (SYRINGE) ×4 IMPLANT
SYR CONTROL 10ML LL (SYRINGE) ×4 IMPLANT
SYRINGE 10CC LL (SYRINGE) ×4 IMPLANT
TOWEL OR 17X24 6PK STRL BLUE (TOWEL DISPOSABLE) ×8 IMPLANT
TRAY DSU PREP LF (CUSTOM PROCEDURE TRAY) ×6 IMPLANT
TRAY FOLEY CATH SILVER 14FR (SET/KITS/TRAYS/PACK) ×4 IMPLANT
TUBE CONNECTING 12'X1/4 (SUCTIONS) ×1
TUBE CONNECTING 12X1/4 (SUCTIONS) ×3 IMPLANT
TUBING AQUILEX INFLOW (TUBING) ×4 IMPLANT
TUBING AQUILEX OUTFLOW (TUBING) ×4 IMPLANT
WATER STERILE IRR 500ML POUR (IV SOLUTION) ×4 IMPLANT
YANKAUER SUCT BULB TIP NO VENT (SUCTIONS) ×4 IMPLANT

## 2014-08-23 NOTE — Anesthesia Preprocedure Evaluation (Signed)
Anesthesia Evaluation  Patient identified by MRN, date of birth, ID band Patient awake    Reviewed: Allergy & Precautions, NPO status , Patient's Chart, lab work & pertinent test results  History of Anesthesia Complications (+) PONV  Airway Mallampati: II  TM Distance: >3 FB Neck ROM: Full    Dental no notable dental hx.    Pulmonary neg pulmonary ROS,  breath sounds clear to auscultation  Pulmonary exam normal       Cardiovascular negative cardio ROS Normal cardiovascular examRhythm:Regular Rate:Normal     Neuro/Psych negative neurological ROS  negative psych ROS   GI/Hepatic negative GI ROS, Neg liver ROS,   Endo/Other  negative endocrine ROSMorbid obesity  Renal/GU negative Renal ROS  negative genitourinary   Musculoskeletal negative musculoskeletal ROS (+)   Abdominal   Peds negative pediatric ROS (+)  Hematology negative hematology ROS (+)   Anesthesia Other Findings   Reproductive/Obstetrics negative OB ROS                             Anesthesia Physical Anesthesia Plan  ASA: II  Anesthesia Plan: General   Post-op Pain Management:    Induction: Intravenous  Airway Management Planned: LMA  Additional Equipment:   Intra-op Plan:   Post-operative Plan: Extubation in OR  Informed Consent: I have reviewed the patients History and Physical, chart, labs and discussed the procedure including the risks, benefits and alternatives for the proposed anesthesia with the patient or authorized representative who has indicated his/her understanding and acceptance.   Dental advisory given  Plan Discussed with: CRNA  Anesthesia Plan Comments:         Anesthesia Quick Evaluation

## 2014-08-23 NOTE — Progress Notes (Signed)
No changes to H&P per patient history Reviewed with patient procedure-Fractional D&C, hysteroscopy, TOT All questions answered Patient states she understands and agrees

## 2014-08-23 NOTE — Transfer of Care (Signed)
Immediate Anesthesia Transfer of Care Note  Patient: [name]  Procedure(s) Performed: Procedure(s) (LRB): DILATATION AND CURETTAGE /HYSTEROSCOPY WITH ECC (N/A) TRANSOBTURATOR SLING (N/A)  Patient Location: PACU  Anesthesia Type: General  Level of Consciousness: awake, sedated, patient cooperative and responds to stimulation  Airway & Oxygen Therapy: Patient Spontanous Breathing and Patient connected to face mask oxygen  Post-op Assessment: Report given to PACU RN, Post -op Vital signs reviewed and stable and Patient moving all extremities  Post vital signs: Reviewed and stable  Complications: No apparent anesthesia complications

## 2014-08-23 NOTE — Progress Notes (Signed)
08/23/2014  12:03 PM  PATIENT:  [name]  39 y.o. female  PRE-OPERATIVE DIAGNOSIS:  MENRORHAGIA, SUI  POST-OPERATIVE DIAGNOSIS:  MENRORHAGIA, SU  PROCEDURE:  Procedure(s): DILATATION AND CURETTAGE /HYSTEROSCOPY WITH ECC (N/A) TRANSOBTURATOR SLING (N/A)  SURGEON:  Surgeon(s) and Role:    * Everlene Farrier, MD - Primary  PHYSICIAN ASSISTANT:   ASSISTANTS: none   ANESTHESIA:   general  EBL:  Total I/O In: 1000 [I.V.:1000] Out: 100 [Blood:100]  BLOOD ADMINISTERED:none  DRAINS: Urinary Catheter (Foley)   LOCAL MEDICATIONS USED:  MARCAINE    and LIDOCAINE   SPECIMEN:  Source of Specimen:  endocervical currettage, endometrial currettage  DISPOSITION OF SPECIMEN:  PATHOLOGY  COUNTS:  YES  TOURNIQUET:  * No tourniquets in log *  DICTATION: .Other Dictation: Dictation Number [id number]  PLAN OF CARE: Discharge to home after PACU  PATIENT DISPOSITION:  PACU - hemodynamically stable.   Delay start of Pharmacological VTE agent (>24hrs) due to surgical blood loss or risk of bleeding: not applicable

## 2014-08-23 NOTE — Anesthesia Postprocedure Evaluation (Signed)
  Anesthesia Post-op Note  Patient: [name]  Procedure(s) Performed: Procedure(s) (LRB): DILATATION AND CURETTAGE /HYSTEROSCOPY WITH ECC (N/A) TRANSOBTURATOR SLING (N/A)  Patient Location: PACU  Anesthesia Type: General  Level of Consciousness: awake and alert   Airway and Oxygen Therapy: Patient Spontanous Breathing  Post-op Pain: mild  Post-op Assessment: Post-op Vital signs reviewed, Patient's Cardiovascular Status Stable, Respiratory Function Stable, Patent Airway and No signs of Nausea or vomiting  Last Vitals:  Filed Vitals:   08/23/14 1220  BP:   Pulse: 120  Temp:   Resp: 17    Post-op Vital Signs: stable   Complications: No apparent anesthesia complications

## 2014-08-23 NOTE — Anesthesia Procedure Notes (Signed)
Procedure Name: LMA Insertion Date/Time: 08/23/2014 11:03 AM Performed by: Justice Rocher Pre-anesthesia Checklist: Patient identified, Emergency Drugs available, Suction available and Patient being monitored Patient Re-evaluated:Patient Re-evaluated prior to inductionOxygen Delivery Method: Circle System Utilized Preoxygenation: Pre-oxygenation with 100% oxygen Intubation Type: IV induction Ventilation: Mask ventilation without difficulty LMA: LMA inserted LMA Size: 4.0 Number of attempts: 1 Airway Equipment and Method: Bite block Placement Confirmation: positive ETCO2 Tube secured with: Tape Dental Injury: Teeth and Oropharynx as per pre-operative assessment

## 2014-08-23 NOTE — Discharge Instructions (Addendum)
Post Anesthesia Home Care Instructions  Activity: Get plenty of rest for the remainder of the day. A responsible adult should stay with you for 24 hours following the procedure.  For the next 24 hours, DO NOT: -Drive a car -Paediatric nurse -Drink alcoholic beverages -Take any medication unless instructed by your physician -Make any legal decisions or sign important papers.  Meals: Start with liquid foods such as gelatin or soup. Progress to regular foods as tolerated. Avoid greasy, spicy, heavy foods. If nausea and/or vomiting occur, drink only clear liquids until the nausea and/or vomiting subsides. Call your physician if vomiting continues.  Special Instructions/Symptoms: Your throat may feel dry or sore from the anesthesia or the breathing tube placed in your throat during surgery. If this causes discomfort, gargle with warm salt water. The discomfort should disappear within 24 hours.  If you had a scopolamine patch placed behind your ear for the management of post- operative nausea and/or vomiting:  1. The medication in the patch is effective for 72 hours, after which it should be removed.  Wrap patch in a tissue and discard in the trash. Wash hands thoroughly with soap and water. 2. You may remove the patch earlier than 72 hours if you experience unpleasant side effects which may include dry mouth, dizziness or visual disturbances. 3. Avoid touching the patch. Wash your hands with soap and water after contact with the patch.     Home care Instructions:   Personal hygiene:  Used sanitary napkins for vaginal drainage not tampons. Shower or tub bathe the day after your procedure. No douching until bleeding stops. Always wipe from front to back after  Elimination.  Activity: Do not drive or operate any equipment today. The effects of the anesthesia are still present and drowsiness may result. Rest today, not necessarily flat bed rest, just take it easy. You may resume your normal  activity in one to 2 days.  Sexual activity: No intercourse for one week or as indicated by your physician  Diet: Eat a light diet as desired this evening. You may resume a regular diet tomorrow.  Return to work: One to 2 days.  General Expectations of your surgery: Vaginal bleeding should be no heavier than a normal period. Spotting may continue up to 10 days. Mild cramps may continue for a couple of days. You may have a regular period in 2-6 weeks.  Unexpected observations call your doctor if these occur: persistent or heavy bleeding. Severe abdominal cramping or pain. Elevation of temperature greater than 100F.   HOME CARE INSTRUCTIONS FOR SLING  Activity:  -No lifting greater than 10-15 pounds for 1 week, or as instructed by your    physician.             -No sexual intercourse until your f/u visit Diet:  You may return to your normal diet tomorrow.   It is important to keep your   bowels regular during the postoperative period.  To avoid constipation, drink plenty of fluids during the day (8-10 glasses) and eat plenty of fresh fruits and vegetables.  Use a mild laxative or stool softener if necessary.  Wound Care:  You may begin showering tomorrow, or as instructed by your physician.      Return to Work as instructed by your physician.    Special Instructions:   Call your physician if any of these symptoms occur:   -temperature greater than 101 degrees Farenheit.   -redness, swelling or drainage at incision site.   -  foul odor of your urine.   -a significant decrease in the amount of urine you have every day.   -severe pain not relieved by your pain medication.    Return to see your doctor in am   Patient Signature:  ________________________________________________________  Nurse's Signature:  ________________________________________________________

## 2014-08-24 ENCOUNTER — Encounter (HOSPITAL_BASED_OUTPATIENT_CLINIC_OR_DEPARTMENT_OTHER): Payer: Self-pay | Admitting: Obstetrics and Gynecology

## 2014-08-24 NOTE — Op Note (Signed)
[name], [name]              ACCOUNT NO.:  [id number]  MEDICAL RECORD NO.:  [id number]  LOCATION:                               FACILITY:  [facility]  PHYSICIAN:  Daleen Bo. Gaetano Net, M.D. DATE OF BIRTH:  09-26-1975  DATE OF PROCEDURE:  08/23/2014 DATE OF DISCHARGE:  08/23/2014                              OPERATIVE REPORT   PREOPERATIVE DIAGNOSES: 1. Menorrhagia. 2. Genuine stress urinary incontinence.  POSTOPERATIVE DIAGNOSES: 1. Menorrhagia. 2. Genuine stress urinary incontinence.  PROCEDURE: 1. Transobturator mid urethral sling. 2. Fractional dilation and curettage.  SURGEON:  Daleen Bo. Gaetano Net, M.D.  ANESTHESIA:  General with LMA.  ESTIMATED BLOOD LOSS:  Less than 100 mL.  I's and O's of distending media, 111 mL deficit.  SPECIMENS: 1. Endocervical curettage. 2. Endometrial curettage, both to Pathology.  INDICATIONS AND CONSENT:  This patient is a 39 year old, G5, P5, status post tubal ligation with heavy menses.  Ultrasound in the office was negative for endometrial masses.  Office endometrial biopsy was benign with a notation of squamous morular metaplasia.  Consultation with GYN/Oncology was done and they recommended fractional dilation and curettage.  She also has leaking with coughing and sneezing. Urodynamics studies have been consistent with genuine stress urinary continence.  After discussing options for the above, fractional D and C, hysteroscopy, and midurethral sling were reviewed with the patient. Potential risks and complications were reviewed preoperatively including, but not limited to, infection, uterine perforation, organ damage, bleeding requiring transfusion of blood products with HIV and hepatitis acquisition, DVT, PE, pneumonia, pelvic pain, abdominal pain, leg pain, painful intercourse, persistent or recurrent stress incontinence, erosion, prolonged catheterization, increased irritative voiding symptoms, return to the operating room.  All  questions have been answered.  The patient states she understands and agrees.  Consent was signed on the chart.  FINDINGS:  Endometrial cavities without abnormal masses.  Both ostia are identified.  DESCRIPTION OF PROCEDURE:  The patient was taken to the operating room, where she was identified, placed in the dorsal supine position, and general anesthesia was induced via LMA.  She was placed in dorsal lithotomy position.  She was prepped and draped in a sterile fashion. Time-out was undertaken.  Bivalve speculum was placed in the vagina and the anterior cervical lip was injected with 1% lidocaine and grasped with a single-tooth tenaculum.  Paracervical block at the 2, 4, 5, 7, 8, and 10 o'clock positions were done with approximately 20 mL of the same solution.  Endocervical curettage was carried out.  Gentle dilation of the endocervical os was carried out and sharp curettage of the endometrial cavity was done as well.  Diagnostic hysteroscope was then placed in the endocervical canal and advanced under direct visualization with distending media.  No abnormal structures were noted and both fallopian tube and ostia were identified.  Hysteroscope was withdrawn. Single-tooth tenaculum was removed.  Good hemostasis was noted and attention was turned to the midurethral sling.  The outer margin of the inferior pubic ramus just below the insertion of the adductor longus was identified and marked with a pen bilaterally.  This was then injected with 0.5% Marcaine with epinephrine.  The same solution was used to inject the  anterior vaginal mucosa below the course of the urethra.  A suburethral longitudinal midline incision was then made below the center of the urethra.  This was dissected bilaterally with Strully scissors toward the obturator.  Stab incision was then made bilaterally to allow passage of the Obtryx Halo needles.  They were then passed bilaterally with the tip of the needle guided  by the contralateral examining finger. The tips of both needles were exited out the suburethral incision. Inspection revealed no buttonholing bilaterally.  The Foley catheter was then removed and cystoscopy was carried out with a 70-degree scope.  A 360-degree inspection revealed no masses, no foreign bodies, and thorough examination revealed no evidence of perforation of the bladder. A good puff of urine was noted from the ureters bilaterally.  The cystoscope was removed.  Foley catheter was replaced.  The bladder was drained, and the Foley was left in place.  The Obtryx sling was then attached to the end of the needle, withdrawn bilaterally.  Care was taken to maintain a space between the urethra and the sling.  The sheath was then removed intact bilaterally.  Inspection revealed the sling to be lying flat with no rolls or kinks.  There was the width of the marking pen from the urethra to the sling providing a spacer.  The pen was removed.  The suburethral incision was closed in a running, locking fashion with 2-0 Vicryl.  Excess sling at the level of the skin was trimmed from the groin incisions bilaterally and both incisions were closed with glue.  Good hemostasis was noted.  The Foley was placed to straight drain.  All counts were correct.  The patient was awakened, taken to the recovery room in stable condition.     Daleen Bo Gaetano Net, M.D.     JET/MEDQ  D:  08/23/2014  T:  08/23/2014  Job:  [id number]

## 2014-09-01 ENCOUNTER — Encounter (HOSPITAL_BASED_OUTPATIENT_CLINIC_OR_DEPARTMENT_OTHER): Payer: Self-pay | Admitting: Obstetrics and Gynecology

## 2015-02-12 ENCOUNTER — Ambulatory Visit: Payer: BLUE CROSS/BLUE SHIELD | Admitting: Nurse Practitioner

## 2015-02-28 ENCOUNTER — Ambulatory Visit: Payer: BLUE CROSS/BLUE SHIELD | Admitting: Nurse Practitioner

## 2015-03-05 ENCOUNTER — Encounter: Payer: Self-pay | Admitting: Nurse Practitioner

## 2015-03-05 ENCOUNTER — Ambulatory Visit (INDEPENDENT_AMBULATORY_CARE_PROVIDER_SITE_OTHER): Payer: BLUE CROSS/BLUE SHIELD | Admitting: Nurse Practitioner

## 2015-03-05 VITALS — BP 100/70 | Ht <= 58 in | Wt 237.4 lb

## 2015-03-05 DIAGNOSIS — Z79899 Other long term (current) drug therapy: Secondary | ICD-10-CM | POA: Diagnosis not present

## 2015-03-05 DIAGNOSIS — R5383 Other fatigue: Secondary | ICD-10-CM

## 2015-03-05 DIAGNOSIS — Z13220 Encounter for screening for lipoid disorders: Secondary | ICD-10-CM

## 2015-03-05 DIAGNOSIS — N920 Excessive and frequent menstruation with regular cycle: Secondary | ICD-10-CM

## 2015-03-05 NOTE — Patient Instructions (Signed)
[facility] What will they cover?  Which provider?

## 2015-03-05 NOTE — Progress Notes (Deleted)
   Subjective:    Patient ID: [name], female    DOB: 1975-06-21, 39 y.o.   MRN: [id number]  HPI Waist circumference- 53 inches   Review of Systems     Objective:   Physical Exam        Assessment & Plan:

## 2015-03-07 ENCOUNTER — Encounter: Payer: Self-pay | Admitting: Nurse Practitioner

## 2015-03-07 LAB — BASIC METABOLIC PANEL
BUN / CREAT RATIO: 10 (ref 8–20)
BUN: 9 mg/dL (ref 6–20)
CO2: 22 mmol/L (ref 18–29)
CREATININE: 0.93 mg/dL (ref 0.57–1.00)
Calcium: 9.2 mg/dL (ref 8.7–10.2)
Chloride: 102 mmol/L (ref 96–106)
GFR calc Af Amer: 90 mL/min/{1.73_m2} (ref >59)
GFR, EST NON AFRICAN AMERICAN: 78 mL/min/{1.73_m2} (ref >59)
Glucose: 109 mg/dL — ABNORMAL HIGH (ref 65–99)
Potassium: 4.8 mmol/L (ref 3.5–5.2)
Sodium: 142 mmol/L (ref 134–144)

## 2015-03-07 LAB — CBC WITH DIFFERENTIAL/PLATELET
BASOS: 1 %
Basophils Absolute: 0 10*3/uL (ref 0.0–0.2)
EOS (ABSOLUTE): 0.1 10*3/uL (ref 0.0–0.4)
EOS: 2 %
HEMATOCRIT: 42.6 % (ref 34.0–46.6)
Hemoglobin: 13.9 g/dL (ref 11.1–15.9)
IMMATURE GRANULOCYTES: 0 %
Immature Grans (Abs): 0 10*3/uL (ref 0.0–0.1)
Lymphocytes Absolute: 2.2 10*3/uL (ref 0.7–3.1)
Lymphs: 39 %
MCH: 29.1 pg (ref 26.6–33.0)
MCHC: 32.6 g/dL (ref 31.5–35.7)
MCV: 89 fL (ref 79–97)
Monocytes Absolute: 0.3 10*3/uL (ref 0.1–0.9)
Monocytes: 6 %
NEUTROS PCT: 52 %
Neutrophils Absolute: 3 10*3/uL (ref 1.4–7.0)
Platelets: 314 10*3/uL (ref 150–379)
RBC: 4.78 x10E6/uL (ref 3.77–5.28)
RDW: 13.4 % (ref 12.3–15.4)
WBC: 5.7 10*3/uL (ref 3.4–10.8)

## 2015-03-07 LAB — TSH: TSH: 1.92 u[IU]/mL (ref 0.450–4.500)

## 2015-03-07 LAB — HEPATIC FUNCTION PANEL
ALBUMIN: 3.8 g/dL (ref 3.5–5.5)
ALT: 19 IU/L (ref 0–32)
AST: 10 IU/L (ref 0–40)
Alkaline Phosphatase: 65 IU/L (ref 39–117)
Bilirubin, Direct: 0.04 mg/dL (ref 0.00–0.40)
TOTAL PROTEIN: 6.5 g/dL (ref 6.0–8.5)

## 2015-03-07 LAB — LIPID PANEL
Chol/HDL Ratio: 3.7 ratio units (ref 0.0–4.4)
Cholesterol, Total: 221 mg/dL — ABNORMAL HIGH (ref 100–199)
HDL: 59 mg/dL (ref >39)
LDL Calculated: 126 mg/dL — ABNORMAL HIGH (ref 0–99)
Triglycerides: 180 mg/dL — ABNORMAL HIGH (ref 0–149)
VLDL Cholesterol Cal: 36 mg/dL (ref 5–40)

## 2015-03-07 NOTE — Progress Notes (Signed)
Subjective:  Presents to discuss weight loss. Has had difficulty losing weight over the past few years. Lost 23 pounds doing Weight Watchers last year but hit a plateau for several months. Was very active at that point going to the gym on a regular basis. Her mother died in 2022/10/30 of this year, gained back most of her weight. Plans to look into bariatric surgery, specifically the gastric sleeve.had a D&C in June, continues to have regular but heavy cycles. Sees gynecology for her physicals. Has not had her lab work done.  Objective:   BP 100/70 mmHg  Ht 4\' 10"  (1.473 m)  Wt 237 lb 6 oz (107.673 kg)  BMI 49.63 kg/m2 NAD. Alert, oriented. Lungs clear. Heart regular rate rhythm. Extreme central obesity noted with a waist circumference of 53 inches.  Assessment:  Problem List Items Addressed This Visit      Other   Menorrhagia - Primary    Other Visit Diagnoses    Morbid obesity, unspecified obesity type ([place])        Other fatigue        Relevant Orders    CBC with Differential/Platelet (Completed)    TSH (Completed)    Screening cholesterol level        Relevant Orders    Lipid panel (Completed)    High risk medication use        Relevant Orders    Hepatic function panel (Completed)    Basic metabolic panel (Completed)      Plan: Discussed options to help her with her weight loss. Increase activity. Discussed first steps in looking into bariatric surgery. Patient to let us know if we can be of assistance. Follow-up with gynecology regarding her cycles.

## 2015-03-15 ENCOUNTER — Telehealth: Payer: Self-pay | Admitting: Nurse Practitioner

## 2015-03-15 NOTE — Telephone Encounter (Signed)
Pt would like results please, try to call her after 2pm on Friday please

## 2015-03-20 ENCOUNTER — Encounter: Payer: Self-pay | Admitting: Family Medicine

## 2015-03-20 ENCOUNTER — Telehealth: Payer: Self-pay | Admitting: Nurse Practitioner

## 2015-03-20 NOTE — Telephone Encounter (Signed)
Patient is getting ready for weight loss surgery.  She wants to know if we can give her Rx for phentermine to help curb her appetite while she prepares so that she doesn't gain too much weight before hand..    [employer]

## 2015-03-20 NOTE — Telephone Encounter (Signed)
Pt is calling again today to request the results of her labs.

## 2015-03-22 ENCOUNTER — Encounter: Payer: Self-pay | Admitting: Nurse Practitioner

## 2015-03-22 DIAGNOSIS — R7301 Impaired fasting glucose: Secondary | ICD-10-CM | POA: Insufficient documentation

## 2015-03-22 NOTE — Telephone Encounter (Signed)
See message with labs

## 2015-03-22 NOTE — Telephone Encounter (Signed)
[facility]

## 2015-03-22 NOTE — Telephone Encounter (Signed)
Has she taken this before? If so, any side effects? How many months until her surgery?

## 2015-03-23 ENCOUNTER — Encounter: Payer: Self-pay | Admitting: Family Medicine

## 2015-03-23 ENCOUNTER — Other Ambulatory Visit: Payer: Self-pay | Admitting: Nurse Practitioner

## 2015-03-23 MED ORDER — PHENTERMINE HCL 37.5 MG PO TABS: 37.5000 mg | ORAL_TABLET | Freq: Every day | ORAL | Status: DC

## 2015-03-23 NOTE — Telephone Encounter (Signed)
Patient states she has taken this before and she had no side effects to this med. She states that she should be able to have her surgery in about 5 months.

## 2015-03-23 NOTE — Telephone Encounter (Signed)
Will send in 30 with 2 refills; will need to see her in 3 months if she wishes to continue

## 2015-03-23 NOTE — Telephone Encounter (Signed)
Left message on voicemail notifying patient that med sent in and to schedule appt in 3 months.

## 2015-03-30 ENCOUNTER — Other Ambulatory Visit: Payer: Self-pay | Admitting: Family Medicine

## 2015-04-02 ENCOUNTER — Ambulatory Visit: Payer: BLUE CROSS/BLUE SHIELD | Admitting: Nurse Practitioner

## 2015-04-02 DIAGNOSIS — Z029 Encounter for administrative examinations, unspecified: Secondary | ICD-10-CM

## 2015-04-10 ENCOUNTER — Ambulatory Visit (INDEPENDENT_AMBULATORY_CARE_PROVIDER_SITE_OTHER): Payer: BLUE CROSS/BLUE SHIELD | Admitting: Nurse Practitioner

## 2015-04-10 ENCOUNTER — Encounter: Payer: Self-pay | Admitting: Nurse Practitioner

## 2015-04-10 ENCOUNTER — Telehealth: Payer: Self-pay | Admitting: Family Medicine

## 2015-04-10 VITALS — BP 116/72 | Ht <= 58 in | Wt 237.2 lb

## 2015-04-10 DIAGNOSIS — R7301 Impaired fasting glucose: Secondary | ICD-10-CM

## 2015-04-10 NOTE — Telephone Encounter (Signed)
Rx prior auth APPROVED for pt's phentermine (ADIPEX-P) 37.5 MG tablet, vaild 04/05/15-07/04/15 through [facility], faxed approval to [facility]

## 2015-04-10 NOTE — Progress Notes (Signed)
Subjective:  Presents for recheck. Has contacted her insurance about bariatric surgery. Will need monthly visits for 6 months. This is the first of those visits. Plans to contact [facility] bariatric clinic to start process. Very active job, works at [employer]. Has a pedometer on her phone. Has taken 3 days of Phentermine without difficulty. Mainly dry mouth. Drinks very little in sugary beverages. Drinks sweet tea once a day about 4 days a week. Picky eater; limited vegetable intake. Does not eat breakfast. Craves salty high carb foods such as chips.   Objective:   BP 116/72 mmHg  Ht 4\' 10"  (1.473 m)  Wt 237 lb 4 oz (107.616 kg)  BMI 49.60 kg/m2 NAD. Alert, oriented. Significant central obesity noted. Lungs clear. Heart RRR.   Assessment:  Problem List Items Addressed This Visit      Endocrine   Elevated fasting glucose - Primary    Other Visit Diagnoses    Morbid obesity due to excess calories ([place])          Plan: set goals for next month which include Walking 10,000 or more steps per day Eating breakfast most days which can be a protein supplement. Start daily MVI Return in about 1 month (around 05/11/2015) for recheck.

## 2015-05-10 ENCOUNTER — Encounter: Payer: Self-pay | Admitting: Nurse Practitioner

## 2015-05-10 ENCOUNTER — Ambulatory Visit (INDEPENDENT_AMBULATORY_CARE_PROVIDER_SITE_OTHER): Payer: BLUE CROSS/BLUE SHIELD | Admitting: Nurse Practitioner

## 2015-05-10 VITALS — BP 120/76 | Ht <= 58 in | Wt 232.2 lb

## 2015-05-10 DIAGNOSIS — R7301 Impaired fasting glucose: Secondary | ICD-10-CM | POA: Diagnosis not present

## 2015-05-10 MED ORDER — PHENTERMINE HCL 37.5 MG PO TABS: 37.5000 mg | ORAL_TABLET | Freq: Every day | ORAL | Status: DC

## 2015-05-11 ENCOUNTER — Encounter: Payer: Self-pay | Admitting: Nurse Practitioner

## 2015-05-11 NOTE — Progress Notes (Signed)
Subjective:  Presents for routine follow-up. Patient's job has changed. A little less active than before. Trying to get in extra steps at least 3-4 days per week. Doing well eating breakfast. Taking daily multivitamin. Has been under increased stress. Taking phentermine without difficulty. No chest pain shortness of breath or palpitations.  Objective:   BP 120/76 mmHg  Ht 4\' 10"  (1.473 m)  Wt 232 lb 4 oz (105.348 kg)  BMI 48.55 kg/m2 NAD. Alert, oriented. Lungs clear. Heart regular rate rhythm. has lost 5 pounds since previous visit.  Assessment:  Problem List Items Addressed This Visit      Endocrine   Elevated fasting glucose - Primary     Other   Morbid obesity due to excess calories (HCC)   Relevant Medications   phentermine (ADIPEX-P) 37.5 MG tablet      Plan:  Meds ordered this encounter  Medications  . phentermine (ADIPEX-P) 37.5 MG tablet    Sig: Take 1 tablet (37.5 mg total) by mouth daily before breakfast.    Dispense:  30 tablet    Refill:  2    [facility]    Order Specific Question:  Supervising Provider    Answer:  Mikey Kirschner [[id number]]   Continue activity and weight loss efforts. Return in about 1 month (around 06/07/2015) for recheck.

## 2015-06-08 ENCOUNTER — Encounter: Payer: Self-pay | Admitting: Nurse Practitioner

## 2015-06-08 ENCOUNTER — Ambulatory Visit (INDEPENDENT_AMBULATORY_CARE_PROVIDER_SITE_OTHER): Payer: BLUE CROSS/BLUE SHIELD | Admitting: Nurse Practitioner

## 2015-06-08 VITALS — BP 110/70 | Ht <= 58 in | Wt 230.5 lb

## 2015-06-08 DIAGNOSIS — R7301 Impaired fasting glucose: Secondary | ICD-10-CM

## 2015-06-08 LAB — POCT GLYCOSYLATED HEMOGLOBIN (HGB A1C): HEMOGLOBIN A1C: 5.4

## 2015-06-08 MED ORDER — PHENTERMINE HCL 37.5 MG PO TABS: 37.5000 mg | ORAL_TABLET | Freq: Every day | ORAL | Status: DC

## 2015-06-09 ENCOUNTER — Encounter: Payer: Self-pay | Admitting: Nurse Practitioner

## 2015-06-09 NOTE — Progress Notes (Signed)
Subjective:  Presents for routine follow-up. Preparing for possible bariatric surgery. Still does well with her diet. Again less active on her new job. Misplaced her last prescription for phentermine, has not taken medication and about a month. Denies any adverse effects.  Objective:   BP 110/70 mmHg  Ht 4\' 10"  (1.473 m)  Wt 230 lb 8 oz (104.554 kg)  BMI 48.19 kg/m2 NAD. Alert, oriented. Lungs clear. Heart regular rate rhythm. Results for orders placed or performed in visit on 06/08/15  POCT HgB A1C  Result Value Ref Range   Hemoglobin A1C 5.4      Assessment:  Problem List Items Addressed This Visit      Endocrine   Elevated fasting glucose - Primary   Relevant Orders   POCT HgB A1C (Completed)     Other   Morbid obesity due to excess calories (HCC)   Relevant Medications   phentermine (ADIPEX-P) 37.5 MG tablet     Plan:  Meds ordered this encounter  Medications  . phentermine (ADIPEX-P) 37.5 MG tablet    Sig: Take 1 tablet (37.5 mg total) by mouth daily before breakfast.    Dispense:  30 tablet    Refill:  2    [facility]    Order Specific Question:  Supervising Provider    Answer:  Mikey Kirschner [[id number]]   Continue healthy diet and weight loss efforts. Restart phentermine as directed. Given information for [facility] bariatric clinic. Return in about 1 month (around 07/09/2015) for weight check.

## 2015-07-06 ENCOUNTER — Encounter: Payer: Self-pay | Admitting: Nurse Practitioner

## 2015-07-06 ENCOUNTER — Ambulatory Visit (INDEPENDENT_AMBULATORY_CARE_PROVIDER_SITE_OTHER): Payer: BLUE CROSS/BLUE SHIELD | Admitting: Nurse Practitioner

## 2015-07-06 VITALS — BP 120/80 | HR 88 | Ht <= 58 in | Wt 232.0 lb

## 2015-07-06 DIAGNOSIS — R7301 Impaired fasting glucose: Secondary | ICD-10-CM

## 2015-07-06 DIAGNOSIS — M6588 Other synovitis and tenosynovitis, other site: Secondary | ICD-10-CM | POA: Diagnosis not present

## 2015-07-06 DIAGNOSIS — M7752 Other enthesopathy of left foot: Secondary | ICD-10-CM

## 2015-07-06 NOTE — Progress Notes (Signed)
Subjective:  Presents for routine follow-up in preparation for bariatric surgery. Has increased her water intake. Stop drinking sodas. Still keeping up with her steps per day, her goal is 10,000 per day. Decided not to take phentermine but to make lifestyle changes. Also having some mild left ankle pain for the past 2 weeks. No specific history of injury. Wears good fitting shoes to work. Minimal edema.  Objective:   BP 120/80 mmHg  Pulse 88  Ht 4\' 10"  (1.473 m)  Wt 232 lb (105.235 kg)  BMI 48.50 kg/m2  SpO2 97%  LMP 06/06/2015 NAD. Alert, oriented. Lungs clear. Heart regular rate rhythm. Minimal change in weight since last visit. Left ankle mild tenderness around the posterior heel near the Achilles tendon. Normal ROM with minimal tenderness. No joint laxity. Upon standing significant pronation is noted in the left ankle.  Assessment:  Problem List Items Addressed This Visit      Endocrine   Elevated fasting glucose - Primary     Other   Morbid obesity due to excess calories ([place])    Other Visit Diagnoses    Left ankle tendinitis          Plan: Anti-inflammatories as directed for ankle pain. Neoprene ankle support. Call back if persists. Continue healthy lifestyle changes. Encourage patient to increase her activity since her new job is more sedentary. Return in about 1 month (around 08/05/2015) for recheck.

## 2015-08-03 ENCOUNTER — Ambulatory Visit (INDEPENDENT_AMBULATORY_CARE_PROVIDER_SITE_OTHER): Payer: BLUE CROSS/BLUE SHIELD | Admitting: Nurse Practitioner

## 2015-08-03 ENCOUNTER — Encounter: Payer: Self-pay | Admitting: Nurse Practitioner

## 2015-08-03 VITALS — BP 126/76 | Ht <= 58 in | Wt 238.2 lb

## 2015-08-03 DIAGNOSIS — R7301 Impaired fasting glucose: Secondary | ICD-10-CM | POA: Diagnosis not present

## 2015-08-03 NOTE — Progress Notes (Signed)
Subjective:  Presents for recheck on her weight. Plans to do the on line medially for the weight loss clinic. Eating small frequent meals. Walking 7000-8000 steps per day. Has gained weight lately but does not know why. Also having decreased hearing in the left ear. Minimal pain. No fever. Mild head congestion. No cough.  Objective:   BP 126/76 mmHg  Ht 4\' 10"  (1.473 m)  Wt 238 lb 4 oz (108.069 kg)  BMI 49.81 kg/m2  LMP 06/06/2015 NAD. Alert, oriented. TMs clear effusion left ear with minimal cerumen. Right ear normal limit. Pharynx clear. Neck supple with minimal adenopathy. Lungs clear. Heart regular rate rhythm. Hearing screen left ear normal.  Assessment:  Problem List Items Addressed This Visit      Endocrine   Elevated fasting glucose - Primary     Other   Morbid obesity due to excess calories (HCC)     Plan: OTC meds as directed for congestion. Encourage patient to continue regular walking and activity. Also substitute Premier protein for one of her meals or snacks to see if this will help. Return in about 1 month (around 09/03/2015) for recheck.

## 2015-08-03 NOTE — Patient Instructions (Addendum)
Premier protein Antihistamine Nasacort AQ as directed

## 2015-08-06 ENCOUNTER — Ambulatory Visit: Payer: BLUE CROSS/BLUE SHIELD | Admitting: Nurse Practitioner

## 2015-09-05 ENCOUNTER — Ambulatory Visit (INDEPENDENT_AMBULATORY_CARE_PROVIDER_SITE_OTHER): Payer: BLUE CROSS/BLUE SHIELD | Admitting: Nurse Practitioner

## 2015-09-05 ENCOUNTER — Encounter: Payer: Self-pay | Admitting: Nurse Practitioner

## 2015-09-05 VITALS — BP 128/82 | Ht <= 58 in | Wt 240.6 lb

## 2015-09-05 DIAGNOSIS — M62838 Other muscle spasm: Secondary | ICD-10-CM

## 2015-09-05 DIAGNOSIS — J300 Vasomotor rhinitis: Secondary | ICD-10-CM | POA: Diagnosis not present

## 2015-09-05 DIAGNOSIS — M6248 Contracture of muscle, other site: Secondary | ICD-10-CM

## 2015-09-05 NOTE — Patient Instructions (Signed)
OTC antihistamine Steroid nasal spray (Nasacort or Flonase)

## 2015-09-05 NOTE — Progress Notes (Signed)
Subjective:  Presents for routine follow-up for her monthly visit for bariatric surgery. Has maintained 12 walking program. Has started drinking protein shakes in between meals. Has decreased her serving portions. Also complaints of a headache off-and-on for the past couple weeks. Always occurs in the morning. Mainly around the frontal area behind the eyes. Describes as a pressure or a dull headache. Had a bad headache yesterday in the right frontal area which has resolved. No nausea vomiting. No visual changes. Also yesterday had some pain in the right neck area and tenderness along the right posterior scalp.  Objective:   BP 128/82 mmHg  Ht 4\' 10"  (1.473 m)  Wt 240 lb 9.6 oz (109.135 kg)  BMI 50.30 kg/m2 NAD. Alert, oriented. Lungs clear. Heart regular rate rhythm. TMs clear effusion, no erythema. Pharynx clear. Neck supple with minimal adenopathy. Lungs clear. Heart regular rate rhythm. Very tight tender muscles noted along the upper back and neck area on the right side.  Assessment:  Problem List Items Addressed This Visit      Other   Morbid obesity due to excess calories ([place])    Other Visit Diagnoses    Vasomotor rhinitis    -  Primary    Muscle spasms of head and/or neck            Plan: OTC antihistamine Steroid nasal spray (Nasacort or Flonase) Anti-inflammatories as directed. Stretching exercises. Massage to affected area on neck and back. Warning signs reviewed. Call back if headaches persist.

## 2015-12-17 ENCOUNTER — Encounter: Payer: Self-pay | Admitting: Family Medicine

## 2016-01-29 ENCOUNTER — Telehealth: Payer: Self-pay | Admitting: Family Medicine

## 2016-01-29 NOTE — Telephone Encounter (Signed)
Pt's having weight loss surgery at [facility] They need OV notes from 03/2015 - 08/2015 for her weight and documentation for weight loss efforts  They need to use this to get approval from insurance approval  Fax# [phone] ATTN: Montel Clock  Please call pt when done or if questions  1st appt there is 01/31/2016 if we could get info sent to them that would be great

## 2016-01-29 NOTE — Telephone Encounter (Signed)
Sure send

## 2016-02-05 ENCOUNTER — Other Ambulatory Visit (HOSPITAL_COMMUNITY): Payer: Self-pay | Admitting: Surgical Oncology

## 2016-02-15 ENCOUNTER — Other Ambulatory Visit (HOSPITAL_COMMUNITY): Payer: Self-pay | Admitting: Surgical Oncology

## 2016-02-15 ENCOUNTER — Ambulatory Visit (HOSPITAL_COMMUNITY)
Admission: RE | Admit: 2016-02-15 | Discharge: 2016-02-15 | Disposition: A | Discharge status: Home / Self Care | Payer: BLUE CROSS/BLUE SHIELD | Source: Ambulatory Visit | Attending: Surgical Oncology | Admitting: Surgical Oncology

## 2016-02-15 DIAGNOSIS — K430 Incisional hernia with obstruction, without gangrene: Secondary | ICD-10-CM | POA: Insufficient documentation

## 2016-02-15 DIAGNOSIS — I10 Essential (primary) hypertension: Secondary | ICD-10-CM | POA: Insufficient documentation

## 2016-02-15 DIAGNOSIS — E7800 Pure hypercholesterolemia, unspecified: Secondary | ICD-10-CM | POA: Insufficient documentation

## 2016-04-04 ENCOUNTER — Encounter: Payer: Self-pay | Admitting: Family Medicine

## 2016-04-04 ENCOUNTER — Ambulatory Visit (INDEPENDENT_AMBULATORY_CARE_PROVIDER_SITE_OTHER): Payer: BLUE CROSS/BLUE SHIELD | Admitting: Family Medicine

## 2016-04-04 VITALS — BP 128/82 | Temp 98.4°F | Wt 244.0 lb

## 2016-04-04 DIAGNOSIS — J029 Acute pharyngitis, unspecified: Secondary | ICD-10-CM | POA: Diagnosis not present

## 2016-04-04 DIAGNOSIS — R509 Fever, unspecified: Secondary | ICD-10-CM

## 2016-04-04 DIAGNOSIS — J111 Influenza due to unidentified influenza virus with other respiratory manifestations: Secondary | ICD-10-CM

## 2016-04-04 LAB — POCT RAPID STREP A (OFFICE): RAPID STREP A SCREEN: NEGATIVE

## 2016-04-04 NOTE — Progress Notes (Signed)
   Subjective:    Patient ID: [name], female    DOB: 09/25/1975, 41 y.o.   MRN: [id number]  HPI Patient in office today c/o throat pain, fever, and body aches.  Patient states her symptoms started Tuesday. Patient with some body aches headaches fatigue tiredness cough now with severe sore throat denies wheezing difficulty breathing denies sweats denies nausea vomiting diarrhea. PMH benign Review of Systems    see above please Objective:   Physical Exam  Lungs clear hearts regular neck no masses not toxic no sign of meningitis or pneumonia throat erythematous but not swollen rapid strep negative      Assessment & Plan:  Viral syndrome Probable flu Tamiflu would not be beneficial at this point If progressive measures or sickness occurred to notify us. Call us if any problems. Warning signs were discussed. Off work next several days.

## 2016-04-04 NOTE — Patient Instructions (Signed)

## 2016-04-06 LAB — CULTURE, GROUP A STREP: Strep A Culture: NEGATIVE

## 2016-09-21 ENCOUNTER — Emergency Department (HOSPITAL_COMMUNITY)
Admission: EM | Admit: 2016-09-21 | Discharge: 2016-09-21 | Disposition: A | Discharge status: Home / Self Care | Payer: BLUE CROSS/BLUE SHIELD | Attending: Emergency Medicine | Admitting: Emergency Medicine

## 2016-09-21 ENCOUNTER — Emergency Department (HOSPITAL_COMMUNITY): Payer: BLUE CROSS/BLUE SHIELD

## 2016-09-21 ENCOUNTER — Encounter (HOSPITAL_COMMUNITY): Payer: Self-pay | Admitting: *Deleted

## 2016-09-21 DIAGNOSIS — N132 Hydronephrosis with renal and ureteral calculous obstruction: Secondary | ICD-10-CM | POA: Insufficient documentation

## 2016-09-21 DIAGNOSIS — R1032 Left lower quadrant pain: Secondary | ICD-10-CM | POA: Diagnosis present

## 2016-09-21 DIAGNOSIS — N200 Calculus of kidney: Secondary | ICD-10-CM

## 2016-09-21 LAB — COMPREHENSIVE METABOLIC PANEL
ALBUMIN: 3.8 g/dL (ref 3.5–5.0)
ALT: 24 U/L (ref 14–54)
AST: 21 U/L (ref 15–41)
Alkaline Phosphatase: 78 U/L (ref 38–126)
Anion gap: 11 (ref 5–15)
BUN: 10 mg/dL (ref 6–20)
CHLORIDE: 112 mmol/L — AB (ref 101–111)
CO2: 20 mmol/L — AB (ref 22–32)
Calcium: 9.5 mg/dL (ref 8.9–10.3)
Creatinine, Ser: 1.11 mg/dL — ABNORMAL HIGH (ref 0.44–1.00)
GFR calc Af Amer: >60 mL/min (ref >60)
GFR calc non Af Amer: >60 mL/min (ref >60)
GLUCOSE: 175 mg/dL — AB (ref 65–99)
POTASSIUM: 3.6 mmol/L (ref 3.5–5.1)
Sodium: 143 mmol/L (ref 135–145)
Total Bilirubin: 0.9 mg/dL (ref 0.3–1.2)
Total Protein: 6.9 g/dL (ref 6.5–8.1)

## 2016-09-21 LAB — URINALYSIS, ROUTINE W REFLEX MICROSCOPIC
Bilirubin Urine: NEGATIVE
GLUCOSE, UA: NEGATIVE mg/dL
Ketones, ur: NEGATIVE mg/dL
Leukocytes, UA: NEGATIVE
Nitrite: NEGATIVE
PROTEIN: 100 mg/dL — AB
Specific Gravity, Urine: 1.027 (ref 1.005–1.030)
Squamous Epithelial / LPF: NONE SEEN
pH: 5 (ref 5.0–8.0)

## 2016-09-21 LAB — CBC WITH DIFFERENTIAL/PLATELET
BASOS ABS: 0 10*3/uL (ref 0.0–0.1)
BASOS PCT: 0 %
Eosinophils Absolute: 0.1 10*3/uL (ref 0.0–0.7)
Eosinophils Relative: 1 %
HEMATOCRIT: 40.4 % (ref 36.0–46.0)
Hemoglobin: 13.5 g/dL (ref 12.0–15.0)
Lymphocytes Relative: 16 %
Lymphs Abs: 1.9 10*3/uL (ref 0.7–4.0)
MCH: 28.6 pg (ref 26.0–34.0)
MCHC: 33.4 g/dL (ref 30.0–36.0)
MCV: 85.6 fL (ref 78.0–100.0)
MONO ABS: 0.6 10*3/uL (ref 0.1–1.0)
Monocytes Relative: 5 %
NEUTROS ABS: 9.6 10*3/uL — AB (ref 1.7–7.7)
Neutrophils Relative %: 78 %
PLATELETS: 277 10*3/uL (ref 150–400)
RBC: 4.72 MIL/uL (ref 3.87–5.11)
RDW: 13.5 % (ref 11.5–15.5)
WBC: 12.2 10*3/uL — ABNORMAL HIGH (ref 4.0–10.5)

## 2016-09-21 LAB — PREGNANCY, URINE: PREG TEST UR: NEGATIVE

## 2016-09-21 LAB — LIPASE, BLOOD: Lipase: 19 U/L (ref 11–51)

## 2016-09-21 MED ORDER — HYDROCODONE-ACETAMINOPHEN 7.5-325 MG/15ML PO SOLN: 15.0000 mL | ORAL | 0 refills | Status: AC | PRN

## 2016-09-21 MED ORDER — CIPROFLOXACIN HCL 500 MG PO TABS: 500.0000 mg | ORAL_TABLET | Freq: Two times a day (BID) | ORAL | 0 refills | Status: DC

## 2016-09-21 MED ORDER — HYDROMORPHONE HCL 1 MG/ML IJ SOLN
INTRAMUSCULAR | Status: AC
  Filled 2016-09-21: qty 1

## 2016-09-21 MED ORDER — SODIUM CHLORIDE 0.9 % IV BOLUS (SEPSIS)
1000.0000 mL | Freq: Once | INTRAVENOUS | Status: AC
  Administered 2016-09-21: 1000 mL via INTRAVENOUS

## 2016-09-21 MED ORDER — HYDROCODONE-ACETAMINOPHEN 7.5-325 MG/15ML PO SOLN
15.0000 mL | Freq: Once | ORAL | Status: AC
  Administered 2016-09-21: 15 mL via ORAL
  Filled 2016-09-21: qty 15

## 2016-09-21 MED ORDER — DEXTROSE 5 % IV SOLN
1.0000 g | Freq: Once | INTRAVENOUS | Status: AC
  Administered 2016-09-21: 1 g via INTRAVENOUS
  Filled 2016-09-21: qty 10

## 2016-09-21 MED ORDER — ONDANSETRON HCL 4 MG/2ML IJ SOLN
4.0000 mg | Freq: Once | INTRAMUSCULAR | Status: AC
  Administered 2016-09-21: 4 mg via INTRAVENOUS

## 2016-09-21 MED ORDER — HYDROMORPHONE HCL 1 MG/ML IJ SOLN
1.0000 mg | Freq: Once | INTRAMUSCULAR | Status: AC
  Administered 2016-09-21: 1 mg via INTRAVENOUS

## 2016-09-21 MED ORDER — ONDANSETRON HCL 4 MG/2ML IJ SOLN
INTRAMUSCULAR | Status: AC
  Filled 2016-09-21: qty 2

## 2016-09-21 MED ORDER — ONDANSETRON HCL 4 MG PO TABS: 4.0000 mg | ORAL_TABLET | Freq: Three times a day (TID) | ORAL | 0 refills | Status: DC | PRN

## 2016-09-21 MED ORDER — ONDANSETRON HCL 4 MG/2ML IJ SOLN
4.0000 mg | Freq: Once | INTRAMUSCULAR | Status: AC
  Administered 2016-09-21: 4 mg via INTRAVENOUS
  Filled 2016-09-21: qty 2

## 2016-09-21 MED ORDER — MORPHINE SULFATE (PF) 4 MG/ML IV SOLN
4.0000 mg | INTRAVENOUS | Status: DC | PRN
  Administered 2016-09-21: 4 mg via INTRAVENOUS
  Filled 2016-09-21: qty 1

## 2016-09-21 NOTE — ED Triage Notes (Signed)
Pt comes in with left flank pain starting today. She was at the movies when this pain started. Denies any urinary changes. Pt is diaphoretic and pale upon triage.

## 2016-09-21 NOTE — ED Provider Notes (Signed)
[facility] DEPT Provider Note   CSN: [id number] Arrival date & time: 09/21/16  1632     History   Chief Complaint Chief Complaint  Patient presents with  . Flank Pain    HPI CICI [name] is a 41 y.o. female.  HPI 41 year old female who presents with sudden onset of left-sided flank pain radiating to the left lower quadrant starting at 3 PM today while watching a movie in the movie theaters. Associated with nausea, vomiting. No fevers or chills, diarrhea, constipation, GI bleed, abnormal vaginal bleeding or discharge. Denies any hematuria, dysuria, or urinary frequency. History of cesarean section, bilateral tubal ligation, laparoscopic cholecystectomy, and gastroplasty duodenal switch. No alleviating or aggravating factors. Past Medical History:  Diagnosis Date  . Menorrhagia   . PONV (postoperative nausea and vomiting)   . SUI (stress urinary incontinence, female)     Patient Active Problem List   Diagnosis Date Noted  . Morbid obesity due to excess calories ([place]) 05/11/2015  . Elevated fasting glucose 03/22/2015  . Menorrhagia 03/05/2015    Past Surgical History:  Procedure Laterality Date  . CESAREAN SECTION  03-09-2005   W/  BILATERAL TUBAL LIGATION  . DILATION AND CURETTAGE OF UTERUS  1995  . DX  LAPAROSOCPY WITH LYSIS ADHESIONS/  D & C HYSTEROSCOPY  11-04-2002  . GASTROPLASTY DUODENAL SWITCH    . HYSTEROSCOPY W/D&C N/A 08/23/2014   Procedure: DILATATION AND CURETTAGE /HYSTEROSCOPY WITH ECC;  Surgeon: Everlene Farrier, MD;  Location: [facility];  Service: Gynecology;  Laterality: N/A;  . LAPAROSCOPIC CHOLECYSTECTOMY  1999  . PUBOVAGINAL SLING N/A 08/23/2014   Procedure: Leonette Nutting;  Surgeon: Everlene Farrier, MD;  Location: [facility];  Service: Gynecology;  Laterality: N/A;  . TUBAL LIGATION  2006    OB History    No data available       Home Medications    Prior to Admission medications   Medication Sig Start Date  End Date Taking? Authorizing Provider  Cyanocobalamin (B-12 PO) Take 1 tablet by mouth daily.   Yes [provider]  Multiple Vitamin (MULTIVITAMIN) capsule Take 1 capsule by mouth daily.   Yes [provider]  omeprazole (PRILOSEC) 20 MG capsule Take 20 mg by mouth daily.    Yes [provider]  ciprofloxacin (CIPRO) 500 MG tablet Take 1 tablet (500 mg total) by mouth 2 (two) times daily. 09/21/16   Forde Dandy, MD  HYDROcodone-acetaminophen (HYCET) 7.5-325 mg/15 ml solution Take 15 mLs by mouth every 4 (four) hours as needed for moderate pain or severe pain. 09/21/16 09/21/17  Forde Dandy, MD  ondansetron (ZOFRAN) 4 MG tablet Take 1 tablet (4 mg total) by mouth every 8 (eight) hours as needed for nausea or vomiting. 09/21/16   Forde Dandy, MD    Family History No family history on file.  Social History Social History  Substance Use Topics  . Smoking status: Never Smoker  . Smokeless tobacco: Never Used  . Alcohol use No     Allergies   Patient has no known allergies.   Review of Systems Review of Systems  Constitutional: Negative for fever.  Respiratory: Negative for shortness of breath.   Cardiovascular: Negative for chest pain.  Gastrointestinal: Positive for abdominal pain, nausea and vomiting.  Genitourinary: Positive for flank pain.  All other systems reviewed and are negative.    Physical Exam Updated Vital Signs BP 111/64   Pulse 99   Temp 97.6 F (36.4 C) (  Oral)   Resp 18   Ht 4\' 10"  (1.473 m)   Wt 82.6 kg (182 lb)   LMP 08/24/2016   SpO2 100%   BMI 38.04 kg/m   Physical Exam Physical Exam  Nursing note and vitals reviewed. Constitutional: Well developed, well nourished, non-toxic, and appears uncomfortable secondary to pain Mouth/Throat: Oropharynx is clear and moist.  Neck: Normal range of motion. Neck supple.  Cardiovascular: Normal rate and regular rhythm.   Pulmonary/Chest: Effort normal and breath sounds normal.    Abdominal: Soft. There is left lower quadrant tenderness. There is no rebound and no guarding.  left CVA tenderness  Musculoskeletal: Normal range of motion.  Neurological: Alert, no facial droop, fluent speech, moves all extremities symmetrically Skin: Skin is warm and dry.  Psychiatric: Cooperative   ED Treatments / Results  Labs (all labs ordered are listed, but only abnormal results are displayed) Labs Reviewed  URINALYSIS, ROUTINE W REFLEX MICROSCOPIC - Abnormal; Notable for the following:       Result Value   Color, Urine AMBER (*)    APPearance CLOUDY (*)    Hgb urine dipstick LARGE (*)    Protein, ur 100 (*)    Bacteria, UA FEW (*)    All other components within normal limits  CBC WITH DIFFERENTIAL/PLATELET - Abnormal; Notable for the following:    WBC 12.2 (*)    Neutro Abs 9.6 (*)    All other components within normal limits  COMPREHENSIVE METABOLIC PANEL - Abnormal; Notable for the following:    Chloride 112 (*)    CO2 20 (*)    Glucose, Bld 175 (*)    Creatinine, Ser 1.11 (*)    All other components within normal limits  URINE CULTURE  PREGNANCY, URINE  LIPASE, BLOOD    EKG  EKG Interpretation None       Radiology Ct Renal Z[place]jas Study  Result Date: 09/21/2016 CLINICAL DATA:  Left-sided flank pain to the left lower quadrant EXAM: CT ABDOMEN AND PELVIS WITHOUT CONTRAST TECHNIQUE: Multidetector CT imaging of the abdomen and pelvis was performed following the standard protocol without IV contrast. COMPARISON:  None. FINDINGS: Lower chest: Lung bases demonstrate no acute consolidation. There are small bilateral pleural effusions. Normal heart size. Mild air distention of the distal esophagus. Hepatobiliary: Probable focal fat infiltration near the falciform ligament. Surgical clips in the gallbladder fossa. Dilated extrahepatic bile ducts, measuring up to 13 mm. Pancreas: Unremarkable. No pancreatic ductal dilatation or surrounding inflammatory changes. Spleen:  Normal in size without focal abnormality. Adrenals/Urinary Tract: Adrenal glands are within normal limits. Mild left hydronephrosis and slight prominence of the left ureter. Possible punctate D[place] in the distal ureter, several cm above the left UVJ on coronal views, series 5, image number 67. Bladder normal Stomach/Bowel: Postsurgical changes of the stomach. Appendix appears normal. No evidence of bowel wall thickening, distention, or inflammatory changes. Vascular/Lymphatic: No significant vascular findings are present. No enlarged abdominal or pelvic lymph nodes. Reproductive: Uterus and bilateral adnexa are unremarkable. Other: No free air or free fluid.  Small fat in the umbilicus. Musculoskeletal: No acute or significant osseous findings. IMPRESSION: 1. Very slight left hydronephrosis and prominence of the left ureter with probable punctate Tetrault in the distal left ureter several cm above the left UVJ. 2. Postsurgical changes of the stomach. No evidence for a bowel obstruction. 3. Post cholecystectomy changes. Dilated extrahepatic bile duct up to 13 mm, probably due to post cholecystectomy change. 4. Small bilateral pleural effusions Electronically Signed  By: Donavan Foil M.D.   On: 09/21/2016 20:00    Procedures Procedures (including critical care time)  Medications Ordered in ED Medications  morphine 4 MG/ML injection 4 mg (4 mg Intravenous Given 09/21/16 1804)  sodium chloride 0.9 % bolus 1,000 mL (0 mLs Intravenous Stopped 09/21/16 1913)  ondansetron (ZOFRAN) injection 4 mg (4 mg Intravenous Given 09/21/16 1803)  HYDROmorphone (DILAUDID) injection 1 mg (1 mg Intravenous Given 09/21/16 1827)  ondansetron (ZOFRAN) injection 4 mg (4 mg Intravenous Given 09/21/16 1827)  cefTRIAXone (ROCEPHIN) 1 g in dextrose 5 % 50 mL IVPB (0 g Intravenous Stopped 09/21/16 1929)  HYDROcodone-acetaminophen (HYCET) 7.5-325 mg/15 ml solution 15 mL (15 mLs Oral Given 09/21/16 2144)     Initial Impression / Assessment and  Plan / ED Course  I have reviewed the triage vital signs and the nursing notes.  Pertinent labs & imaging results that were available during my care of the patient were reviewed by me and considered in my medical decision making (see chart for details).     41 year old female presenting with sudden onset of left flank pain with nausea and vomiting. Clinical presentation concerning for a kidney Sibal or pyelonephritis. She is nontoxic but appears very uncomfortable secondary to pain. Afebrile and hemodynamically stable. With left CVA tenderness and left lower quadrant abdominal pain on exam. Abdomen nonsurgical. CT renal [place] study shows punctate Ogas in the left distal ureter with mild hydronephrosis. No other acute intra-abdominal processes.  UA with too numerous to count RBCs as well as too numerous to count WBCs, but without leukocyte esterase or nitrites. Mild leukocytosis of 12 on her blood work. Pain has been well controlled with IV analgesics. She did receive a dose of ceftriaxone given her urine. I spoke with Dr. Consuella Lose, given UA findings. Given her well appearance and improved symptoms, just do not feel findings were concerning for infected Markes that will require urgent or emergent stent placement. He did recommend discharge with ciprofloxacin and close outpatient follow-up in urology office. Patient was comfortable with this plan. Prescriptions provided for antibiotics, pain control, and anti-emetics. Urology information was provided for her to set up follow-up. Strict return and follow-up instructions reviewed. She expressed understanding of all discharge instructions and felt comfortable with the plan of care.   Final Clinical Impressions(s) / ED Diagnoses   Final diagnoses:  Ureteral Postema with hydronephrosis  Kidney Gilkerson    New Prescriptions Discharge Medication List as of 09/21/2016  9:23 PM    START taking these medications   Details  ciprofloxacin (CIPRO) 500 MG tablet  Take 1 tablet (500 mg total) by mouth 2 (two) times daily., Starting Sun 09/21/2016, Print    HYDROcodone-acetaminophen (HYCET) 7.5-325 mg/15 ml solution Take 15 mLs by mouth every 4 (four) hours as needed for moderate pain or severe pain., Starting Sun 09/21/2016, Until Mon 09/21/2017, Print    ondansetron (ZOFRAN) 4 MG tablet Take 1 tablet (4 mg total) by mouth every 8 (eight) hours as needed for nausea or vomiting., Starting Sun 09/21/2016, Print         Forde Dandy, MD 09/21/16 2226

## 2016-09-21 NOTE — Discharge Instructions (Signed)
You have a small kidney Thoen in the left side that has a very good chance of passing by itself.   We have placed you on antibiotics in case there may be an infection in the urine with this [place].  Please call urology tomorrow to set up close follow-up appointment.  Please return without fail for worsening symptoms, including fever, intractable vomiting, escalating pain, or any other symptoms concerning to you.

## 2016-09-21 NOTE — ED Notes (Signed)
Patient transported to CT 

## 2016-09-23 LAB — URINE CULTURE

## 2016-10-15 ENCOUNTER — Ambulatory Visit (INDEPENDENT_AMBULATORY_CARE_PROVIDER_SITE_OTHER): Payer: BLUE CROSS/BLUE SHIELD | Admitting: Urology

## 2016-10-15 DIAGNOSIS — N201 Calculus of ureter: Secondary | ICD-10-CM

## 2016-10-17 ENCOUNTER — Other Ambulatory Visit: Payer: Self-pay | Admitting: Urology

## 2016-10-17 DIAGNOSIS — N201 Calculus of ureter: Secondary | ICD-10-CM

## 2016-11-10 ENCOUNTER — Encounter (HOSPITAL_COMMUNITY): Payer: Self-pay

## 2016-11-10 ENCOUNTER — Ambulatory Visit (HOSPITAL_COMMUNITY): Admission: RE | Admit: 2016-11-10 | Payer: BLUE CROSS/BLUE SHIELD | Source: Ambulatory Visit

## 2016-11-10 ENCOUNTER — Ambulatory Visit (HOSPITAL_COMMUNITY)
Admission: RE | Admit: 2016-11-10 | Discharge: 2016-11-10 | Disposition: A | Discharge status: Home / Self Care | Payer: BLUE CROSS/BLUE SHIELD | Source: Ambulatory Visit | Attending: Urology | Admitting: Urology

## 2016-11-12 ENCOUNTER — Ambulatory Visit: Payer: BLUE CROSS/BLUE SHIELD | Admitting: Urology

## 2017-01-19 ENCOUNTER — Ambulatory Visit
Admission: RE | Admit: 2017-01-19 | Discharge: 2017-01-19 | Disposition: A | Discharge status: Home / Self Care | Payer: BLUE CROSS/BLUE SHIELD | Source: Ambulatory Visit | Attending: Radiology | Admitting: Radiology

## 2017-01-19 ENCOUNTER — Other Ambulatory Visit: Payer: Self-pay | Admitting: Radiology

## 2017-01-19 DIAGNOSIS — N63 Unspecified lump in unspecified breast: Secondary | ICD-10-CM

## 2017-05-18 ENCOUNTER — Encounter: Payer: Self-pay | Admitting: Family Medicine

## 2017-07-20 ENCOUNTER — Inpatient Hospital Stay
Admission: RE | Admit: 2017-07-20 | Discharge: 2017-07-20 | Disposition: A | Discharge status: Home / Self Care | Payer: BLUE CROSS/BLUE SHIELD | Source: Ambulatory Visit | Attending: Radiology | Admitting: Radiology

## 2017-10-19 ENCOUNTER — Telehealth: Payer: Self-pay | Admitting: Family Medicine

## 2017-10-19 DIAGNOSIS — D229 Melanocytic nevi, unspecified: Secondary | ICD-10-CM

## 2017-10-19 NOTE — Telephone Encounter (Signed)
Lets do someone other than [facility] and other than [facility], let pt know that derm in general are takin g a veryu long time to get in to see

## 2017-10-19 NOTE — Telephone Encounter (Signed)
Referral put in.

## 2017-10-19 NOTE — Telephone Encounter (Signed)
Left message to return call 

## 2017-10-19 NOTE — Telephone Encounter (Signed)
Pt is needing a referral for dermatology to have a mole removed. She has scratched it and it is right at her bra line and rubbing. She would like Dr. Richardson Landry to make a recommendation other than [facility]. It would be a couple of months before she could be seen there as a new pt.

## 2017-10-19 NOTE — Telephone Encounter (Signed)
Pt would like a call back with name of derm that she is being referred to.

## 2018-04-07 ENCOUNTER — Telehealth: Payer: Self-pay | Admitting: Family Medicine

## 2018-04-07 ENCOUNTER — Other Ambulatory Visit: Payer: Self-pay | Admitting: *Deleted

## 2018-04-07 MED ORDER — SULFACETAMIDE SODIUM 10 % OP SOLN: OPHTHALMIC | 0 refills | Status: DC

## 2018-04-07 NOTE — Telephone Encounter (Signed)
Pt's daughter has possible pink eye (tel enc on her chart). Mom's eye is not watery or irritated but it is itching. She has been cleaning her daughters eye and is worried she might have touched her eye after cleaning hers.   Could eye drops be called in to [facility], [place]

## 2018-04-07 NOTE — Telephone Encounter (Signed)
Sure give her some too

## 2018-04-07 NOTE — Telephone Encounter (Signed)
I sent in sulfacetamide drops for her daughter this morning per protocol but not sure if mother can have drops since no eye drainage, redness, or irritation just itching. Please advise.

## 2018-04-07 NOTE — Telephone Encounter (Signed)
Med sent to pharm. Pt notified.  

## 2018-07-23 ENCOUNTER — Other Ambulatory Visit: Payer: Self-pay | Admitting: Radiology

## 2018-07-23 DIAGNOSIS — N6310 Unspecified lump in the right breast, unspecified quadrant: Secondary | ICD-10-CM

## 2018-08-06 ENCOUNTER — Ambulatory Visit
Admission: RE | Admit: 2018-08-06 | Discharge: 2018-08-06 | Disposition: A | Discharge status: Home / Self Care | Payer: BLUE CROSS/BLUE SHIELD | Source: Ambulatory Visit | Attending: Radiology | Admitting: Radiology

## 2018-08-06 ENCOUNTER — Other Ambulatory Visit: Payer: Self-pay

## 2018-08-06 DIAGNOSIS — N6310 Unspecified lump in the right breast, unspecified quadrant: Secondary | ICD-10-CM

## 2019-04-18 ENCOUNTER — Encounter: Payer: Self-pay | Admitting: Family Medicine

## 2019-07-12 ENCOUNTER — Other Ambulatory Visit: Payer: Self-pay | Admitting: Radiology

## 2019-07-12 DIAGNOSIS — N6310 Unspecified lump in the right breast, unspecified quadrant: Secondary | ICD-10-CM

## 2019-08-09 ENCOUNTER — Ambulatory Visit
Admission: RE | Admit: 2019-08-09 | Discharge: 2019-08-09 | Disposition: A | Discharge status: Home / Self Care | Payer: BC Managed Care – PPO | Source: Ambulatory Visit | Attending: Radiology | Admitting: Radiology

## 2019-08-09 ENCOUNTER — Other Ambulatory Visit: Payer: Self-pay

## 2019-08-09 DIAGNOSIS — N6310 Unspecified lump in the right breast, unspecified quadrant: Secondary | ICD-10-CM

## 2019-09-15 ENCOUNTER — Telehealth: Payer: Self-pay | Admitting: Family Medicine

## 2019-09-15 NOTE — Telephone Encounter (Signed)
Pt need cold soar med by mouth she has had it in the past and went to the beach and had a outbreak.

## 2019-09-15 NOTE — Telephone Encounter (Signed)
Pt hasn't been in over 1 yr.  Needs to establish care. Pt can take abreva over the counter.   Thx.   Dr. Lovena Le

## 2019-09-16 NOTE — Telephone Encounter (Signed)
Patient notified of Dr. Forde Dandy recommendation. She states she is starting a new job and unsble to schedule an appointment at this time. She is currently using abreva and a cream Dr. Richardson Landry had prescribed.

## 2020-03-17 HISTORY — PX: AUGMENTATION MAMMAPLASTY: SUR837

## 2021-01-14 DIAGNOSIS — S0531XA Ocular laceration without prolapse or loss of intraocular tissue, right eye, initial encounter: Secondary | ICD-10-CM | POA: Diagnosis not present

## 2021-08-10 ENCOUNTER — Ambulatory Visit: Payer: BC Managed Care – PPO

## 2021-10-10 DIAGNOSIS — Z6839 Body mass index (BMI) 39.0-39.9, adult: Secondary | ICD-10-CM | POA: Diagnosis not present

## 2021-10-10 DIAGNOSIS — Z9884 Bariatric surgery status: Secondary | ICD-10-CM | POA: Diagnosis not present

## 2021-10-10 DIAGNOSIS — K219 Gastro-esophageal reflux disease without esophagitis: Secondary | ICD-10-CM | POA: Diagnosis not present

## 2021-10-10 DIAGNOSIS — D508 Other iron deficiency anemias: Secondary | ICD-10-CM | POA: Diagnosis not present

## 2021-10-10 DIAGNOSIS — E6609 Other obesity due to excess calories: Secondary | ICD-10-CM | POA: Diagnosis not present

## 2021-10-11 ENCOUNTER — Ambulatory Visit (INDEPENDENT_AMBULATORY_CARE_PROVIDER_SITE_OTHER): Payer: BLUE CROSS/BLUE SHIELD | Admitting: Family Medicine

## 2021-10-11 VITALS — BP 106/71 | HR 82 | Temp 98.3°F | Ht <= 58 in | Wt 177.8 lb

## 2021-10-11 DIAGNOSIS — E559 Vitamin D deficiency, unspecified: Secondary | ICD-10-CM | POA: Diagnosis not present

## 2021-10-11 DIAGNOSIS — Z130 Encounter for screening for diseases of the blood and blood-forming organs and certain disorders involving the immune mechanism: Secondary | ICD-10-CM

## 2021-10-11 DIAGNOSIS — K219 Gastro-esophageal reflux disease without esophagitis: Secondary | ICD-10-CM | POA: Diagnosis not present

## 2021-10-11 DIAGNOSIS — E669 Obesity, unspecified: Secondary | ICD-10-CM | POA: Diagnosis not present

## 2021-10-11 DIAGNOSIS — B001 Herpesviral vesicular dermatitis: Secondary | ICD-10-CM

## 2021-10-11 DIAGNOSIS — E782 Mixed hyperlipidemia: Secondary | ICD-10-CM | POA: Diagnosis not present

## 2021-10-11 DIAGNOSIS — R7309 Other abnormal glucose: Secondary | ICD-10-CM

## 2021-10-11 MED ORDER — VALACYCLOVIR HCL 1 G PO TABS: 1000.0000 mg | ORAL_TABLET | Freq: Two times a day (BID) | ORAL | 3 refills | Status: DC

## 2021-10-11 MED ORDER — ACYCLOVIR 5 % EX OINT: TOPICAL_OINTMENT | CUTANEOUS | 1 refills | Status: AC

## 2021-10-11 NOTE — Patient Instructions (Signed)
Follow up in 6 months to 1 year.  Take care  Dr. Lacinda Axon

## 2021-10-12 ENCOUNTER — Encounter: Payer: Self-pay | Admitting: Family Medicine

## 2021-10-12 DIAGNOSIS — E782 Mixed hyperlipidemia: Secondary | ICD-10-CM | POA: Insufficient documentation

## 2021-10-12 DIAGNOSIS — K219 Gastro-esophageal reflux disease without esophagitis: Secondary | ICD-10-CM | POA: Insufficient documentation

## 2021-10-12 DIAGNOSIS — E559 Vitamin D deficiency, unspecified: Secondary | ICD-10-CM | POA: Insufficient documentation

## 2021-10-12 DIAGNOSIS — B001 Herpesviral vesicular dermatitis: Secondary | ICD-10-CM | POA: Insufficient documentation

## 2021-10-12 NOTE — Progress Notes (Signed)
Subjective:  Patient ID: [name], female    DOB: 04-29-1975  Age: 46 y.o. MRN: [id number]  CC: Establish care  HPI [name] is a 46 y.o. female presents to the clinic today to establish care.  Patient has had prior bariatric surgery (Duodenal switch). Recently seen by [facility] physician. Had labs (see Care Everywhere).  Having GERD. Advised to take the Omeprazole.  Has history of herpes labialis. Requesting refills on Acyclovir ointment and Valtrex for flares/recurrences.  Overall, she is doing well. She is going to be working on weight loss again following weight gain.  Needs pap and mammogram.    PMH, Surgical Hx, Family Hx, Social History reviewed and updated as below.  Past Medical History:  Diagnosis Date   Herpes labialis    Hyperlipidemia    Obesity (BMI 30-39.9)    Past Surgical History:  Procedure Laterality Date   CESAREAN SECTION  03-09-2005   W/  BILATERAL TUBAL LIGATION   DILATION AND CURETTAGE OF UTERUS  1995   DX  LAPAROSOCPY WITH LYSIS ADHESIONS/  D & C HYSTEROSCOPY  11-04-2002   GASTROPLASTY DUODENAL SWITCH     HYSTEROSCOPY WITH D & C N/A 08/23/2014   Procedure: DILATATION AND CURETTAGE /HYSTEROSCOPY WITH ECC;  Surgeon: Everlene Farrier, MD;  Location: [facility];  Service: Gynecology;  Laterality: N/A;   LAPAROSCOPIC CHOLECYSTECTOMY  1999   PUBOVAGINAL SLING N/A 08/23/2014   Procedure: Leonette Nutting;  Surgeon: Everlene Farrier, MD;  Location: [facility];  Service: Gynecology;  Laterality: N/A;   TUBAL LIGATION  2006   Family History  Problem Relation Age of Onset   Breast cancer Maternal Grandmother    Social History   Tobacco Use   Smoking status: Never   Smokeless tobacco: Never  Substance Use Topics   Alcohol use: No   Review of Systems  Constitutional:        Weight gain.  Respiratory: Negative.    Cardiovascular: Negative.   Gastrointestinal:        Bloating.   Objective:    Today's Vitals: BP 106/71   Pulse 82   Temp 98.3 F (36.8 C) (Oral)   Ht '4\' 10"'$  (1.473 m)   Wt 177 lb 12.8 oz (80.6 kg)   SpO2 96%   BMI 37.16 kg/m   Physical Exam Vitals and nursing note reviewed.  Constitutional:      General: She is not in acute distress.    Appearance: Normal appearance. She is not ill-appearing.  HENT:     Head: Normocephalic and atraumatic.  Eyes:     General:        Right eye: No discharge.        Left eye: No discharge.     Conjunctiva/sclera: Conjunctivae normal.  Cardiovascular:     Rate and Rhythm: Normal rate and regular rhythm.  Pulmonary:     Effort: Pulmonary effort is normal.     Breath sounds: Normal breath sounds. No wheezing, rhonchi or rales.  Neurological:     Mental Status: She is alert.  Psychiatric:        Mood and Affect: Mood normal.        Behavior: Behavior normal.    Assessment & Plan:   Problem List Items Addressed This Visit       Digestive   GERD (gastroesophageal reflux disease) - Primary    Advised to take Omeprazole.       Herpes labialis    Refilled  Acyclovir ointment and Valtrex.      Relevant Medications   valACYclovir (VALTREX) 1000 MG tablet   acyclovir ointment (ZOVIRAX) 5 %     Other   Mixed hyperlipidemia    Lipid panel was not done on most recent labs. Will order at next visit.      Obesity (BMI 30-39.9)    Following with [facility].      Vitamin D deficiency    Meds ordered this encounter  Medications   valACYclovir (VALTREX) 1000 MG tablet    Sig: Take 1 tablet (1,000 mg total) by mouth 2 (two) times daily.    Dispense:  60 tablet    Refill:  3   acyclovir ointment (ZOVIRAX) 5 %    Sig: Apply 5 times daily.    Dispense:  30 g    Refill:  1   Follow-up: 6 months to 1 year.  [place]

## 2021-10-12 NOTE — Assessment & Plan Note (Signed)
Advised to take Omeprazole.

## 2021-10-12 NOTE — Assessment & Plan Note (Signed)
Refilled Acyclovir ointment and Valtrex.

## 2021-10-12 NOTE — Assessment & Plan Note (Signed)
Lipid panel was not done on most recent labs. Will order at next visit.

## 2021-10-12 NOTE — Assessment & Plan Note (Signed)
Following with [facility].

## 2021-11-15 IMAGING — MG DIGITAL DIAGNOSTIC BILAT W/ TOMO W/ CAD
8 series · 9 of 24 positions shown · non-contrast
Comparison: Previous exam(s).

CLINICAL DATA: Patient presents for 2 year follow-up of probably
benign right breast mass.

EXAM:
DIGITAL DIAGNOSTIC BILATERAL MAMMOGRAM WITH CAD AND TOMO
ULTRASOUND RIGHT BREAST

[L CC synth-2D]
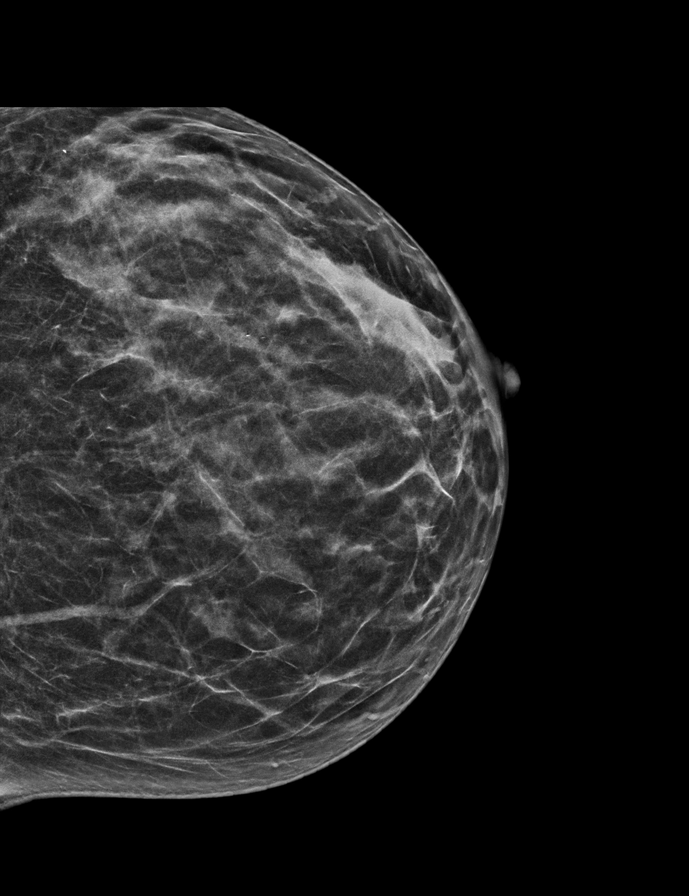

[R CC synth-2D]
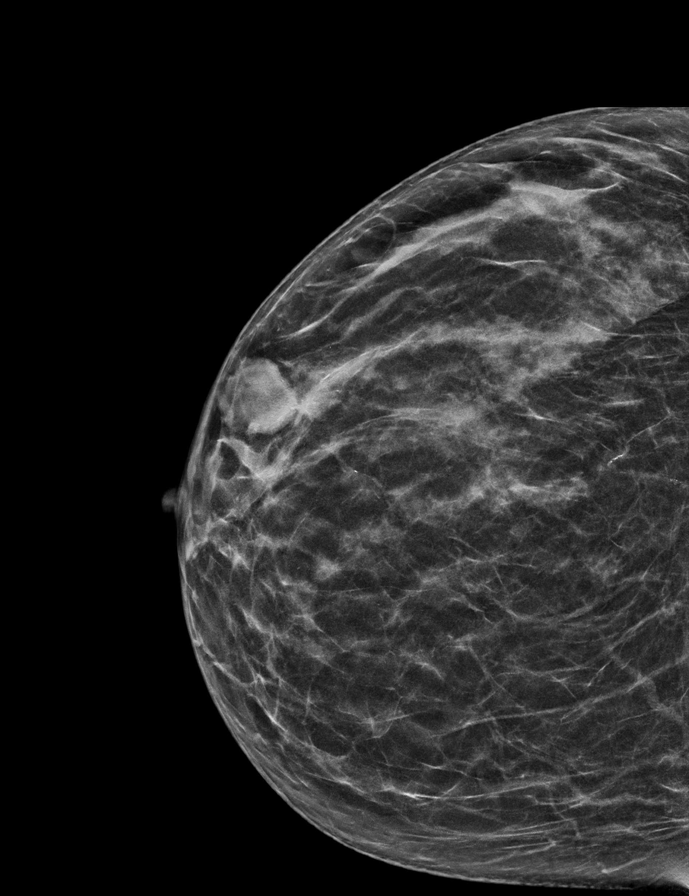

[L MLO synth-2D]
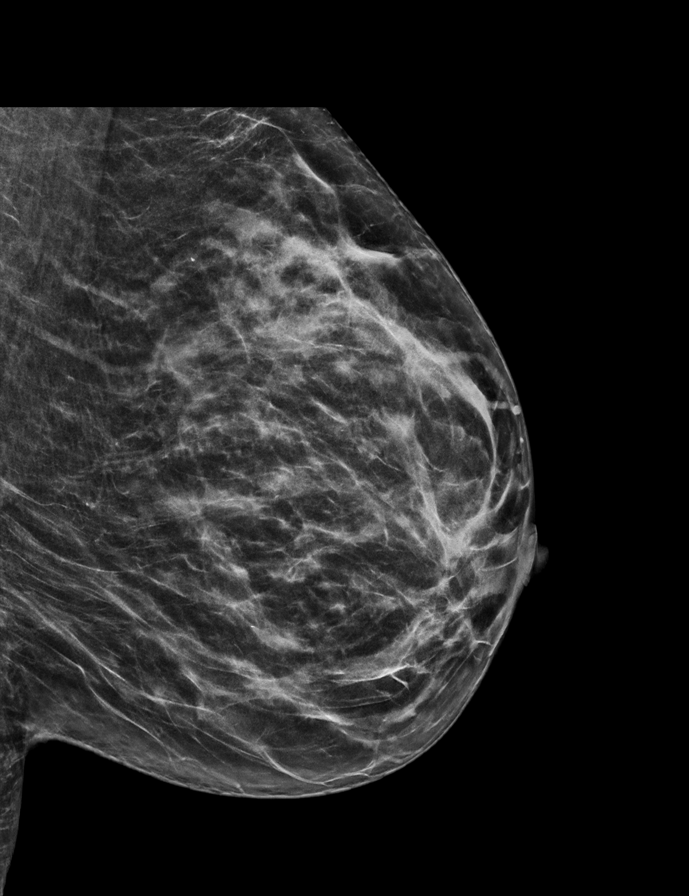

[R MLO synth-2D]
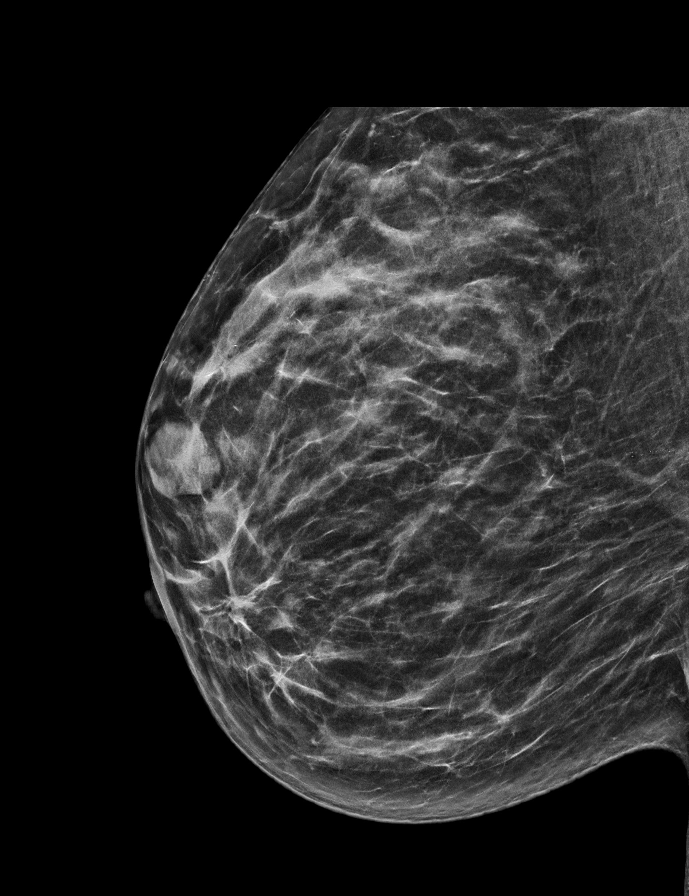

[R MLO tomo · 2 of 53 frames shown]
[frame 18/53]
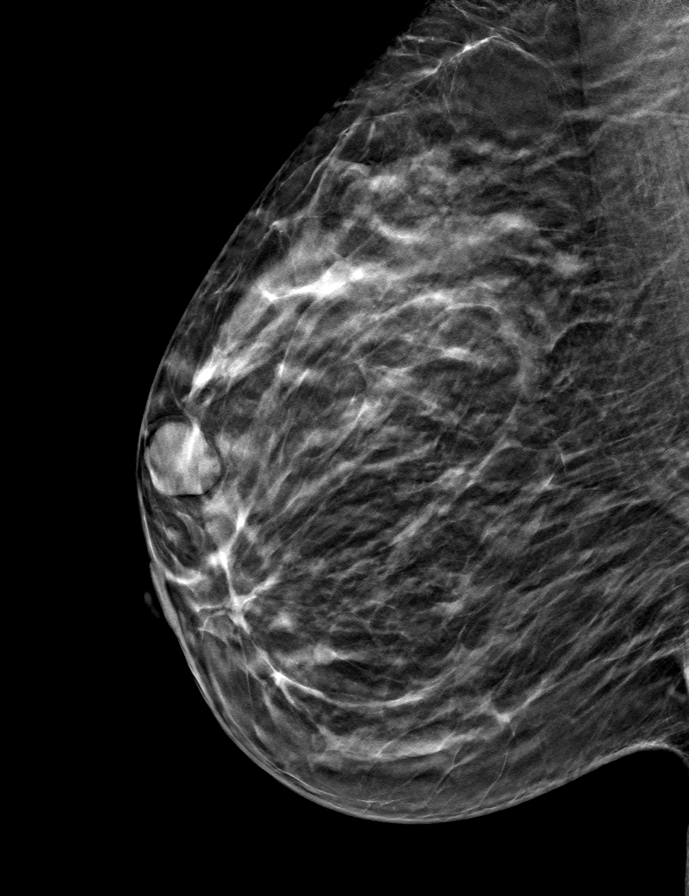
[frame 27/53]
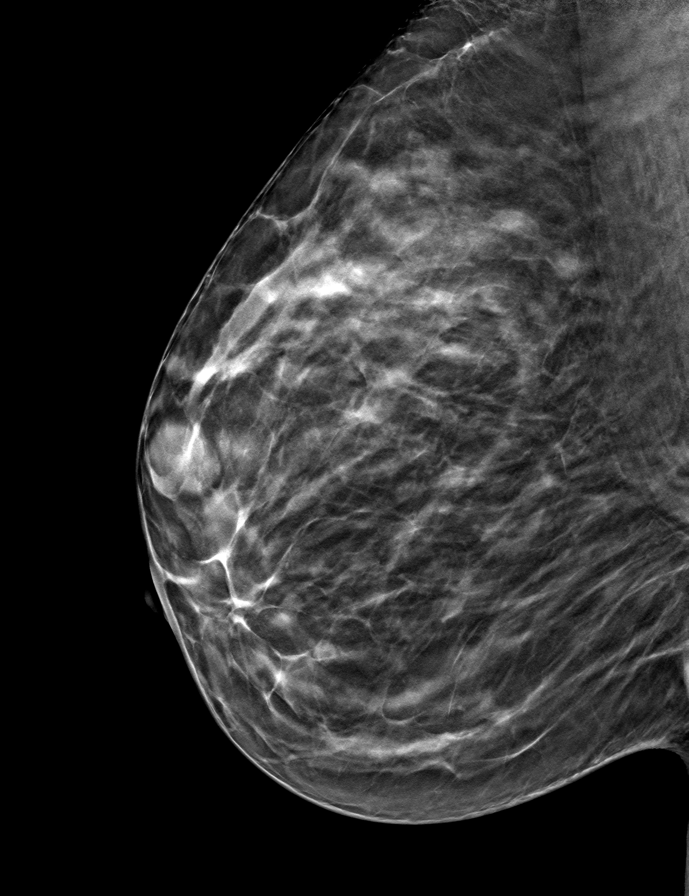

[R CC tomo · tomo slice 26/51.0]
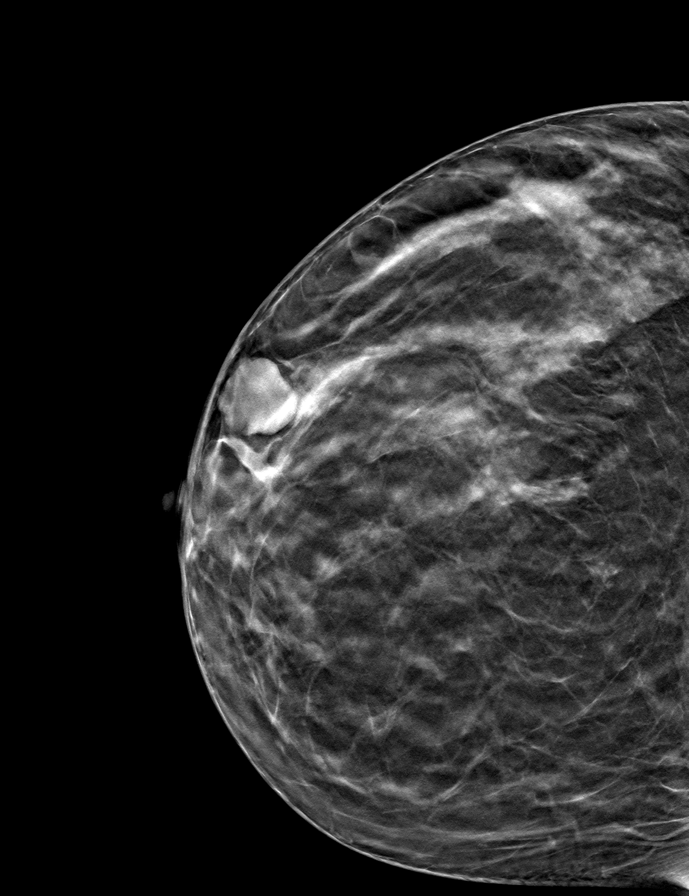

[L CC tomo · tomo slice 25/50.0]
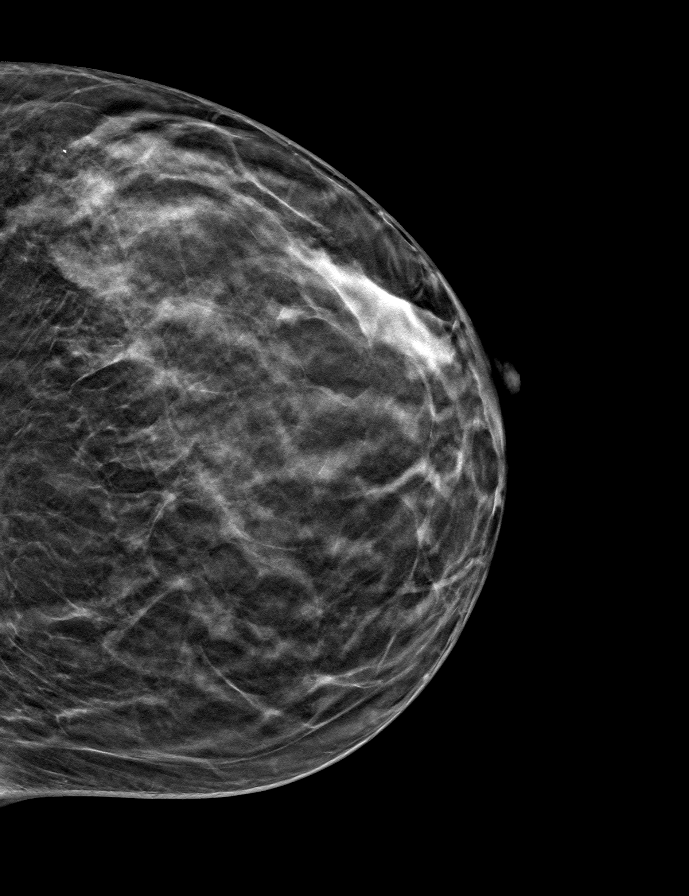

[L MLO tomo · tomo slice 29/56.0]
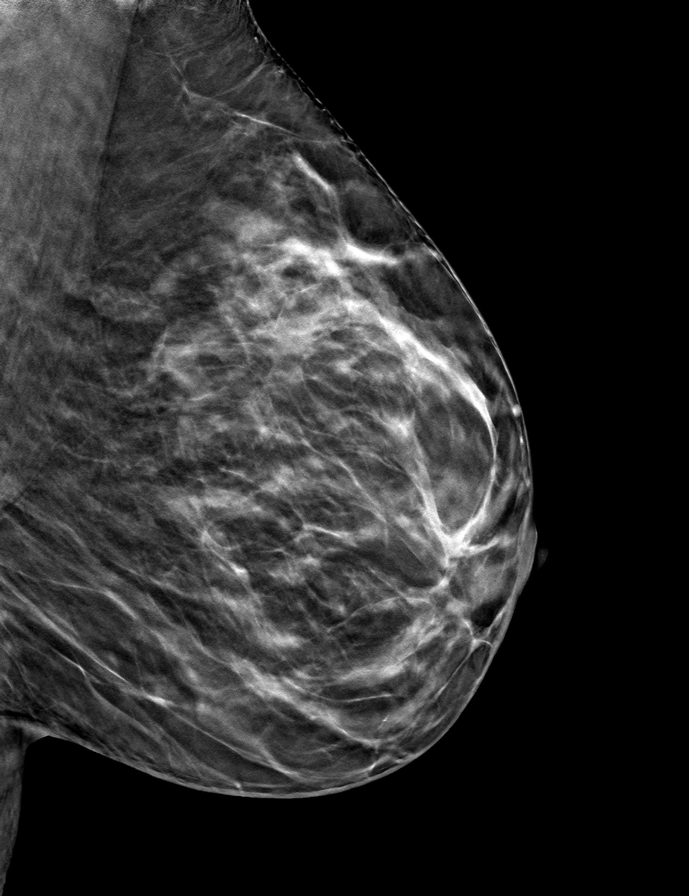

[9 of 24 positions shown; findings below may reference images not displayed]

ACR Breast Density Category c: The breast tissue is heterogeneously
dense, which may obscure small masses.
FINDINGS: Grossly unchanged oval circumscribed mass within the upper-outer
right breast. No additional concerning masses, calcifications or
distortion identified within the right or left breast.

Mammographic images were processed with CAD.

Targeted ultrasound is performed, showing a stable 1.6 x 1.7 x
cm oval circumscribed hypoechoic mass right breast 11 o'clock
position 2 cm from the nipple favored to represent a fibroadenoma.
IMPRESSION: Benign right breast mass 11 o'clock position favored to represent a
fibroadenoma given stability over time.

No mammographic evidence for malignancy.

RECOMMENDATION:
Screening mammogram in one year.(Code:WV-R-9WT)

I have discussed the findings and recommendations with the patient.
If applicable, a reminder letter will be sent to the patient
regarding the next appointment.

BI-RADS CATEGORY  2: Benign.

## 2021-11-15 IMAGING — US US BREAST*R* LIMITED INC AXILLA
1 series · 7 of 7 positions shown · non-contrast
Comparison: Previous exam(s).

CLINICAL DATA: Patient presents for 2 year follow-up of probably
benign right breast mass.

EXAM:
DIGITAL DIAGNOSTIC BILATERAL MAMMOGRAM WITH CAD AND TOMO
ULTRASOUND RIGHT BREAST

[Series 1: us breast*right* limited inc axilla · 0.06mm/px · 7 of 7 slices shown]
[im 1/7]
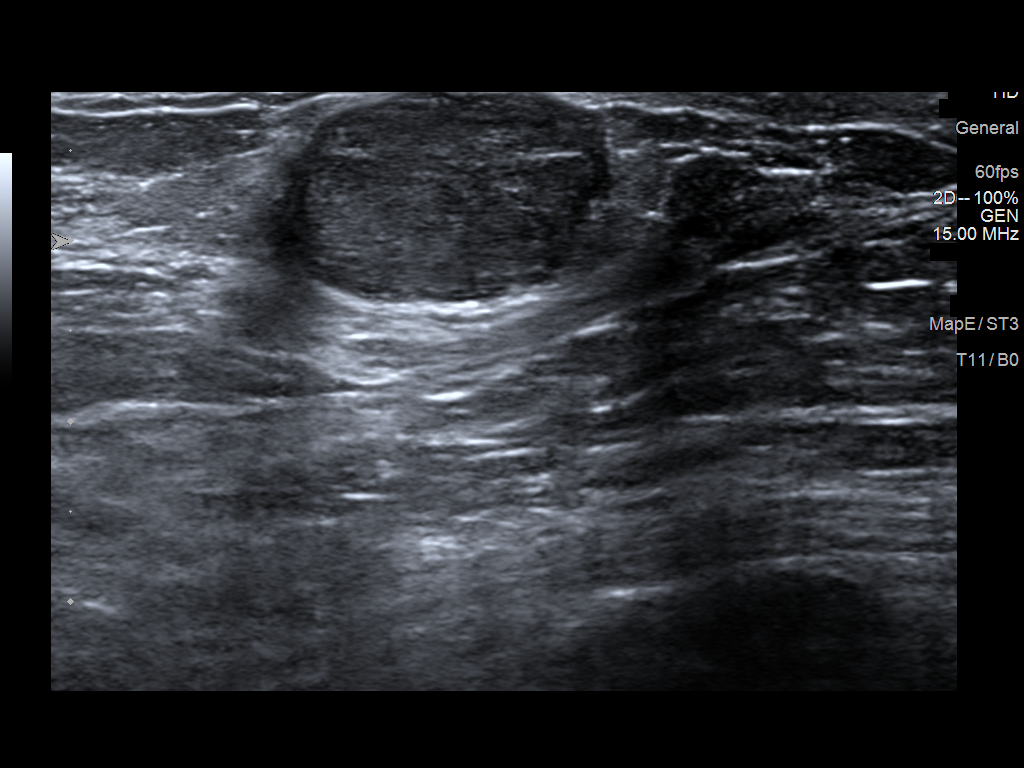
[im 2/7]
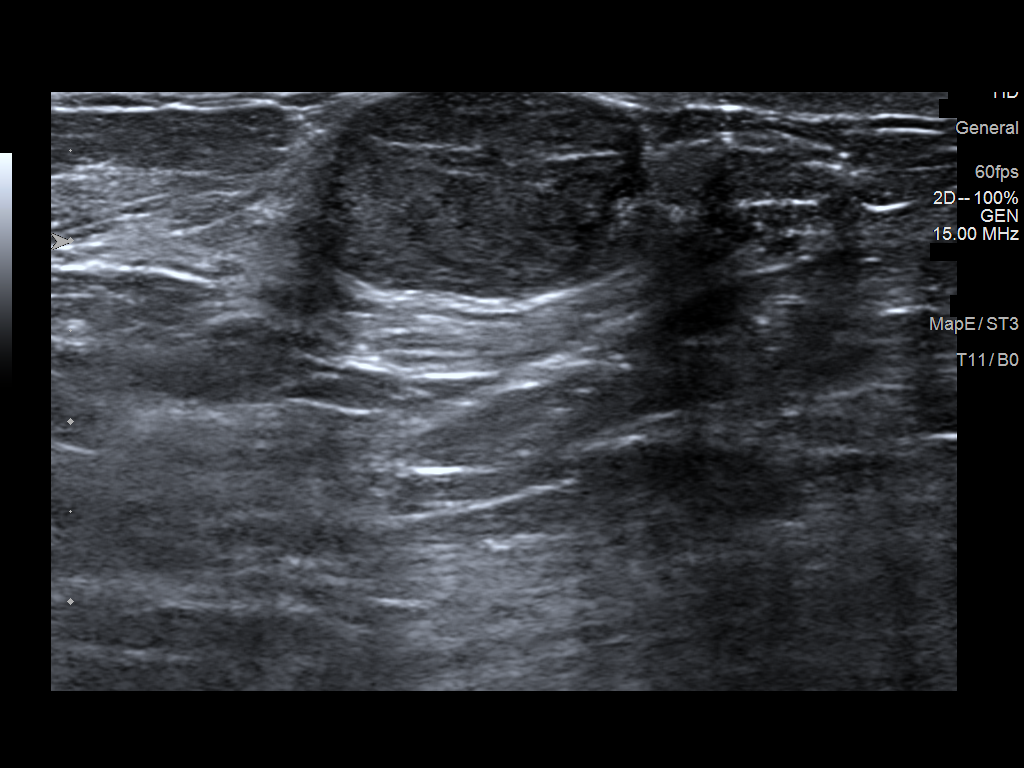
[im 3/7]
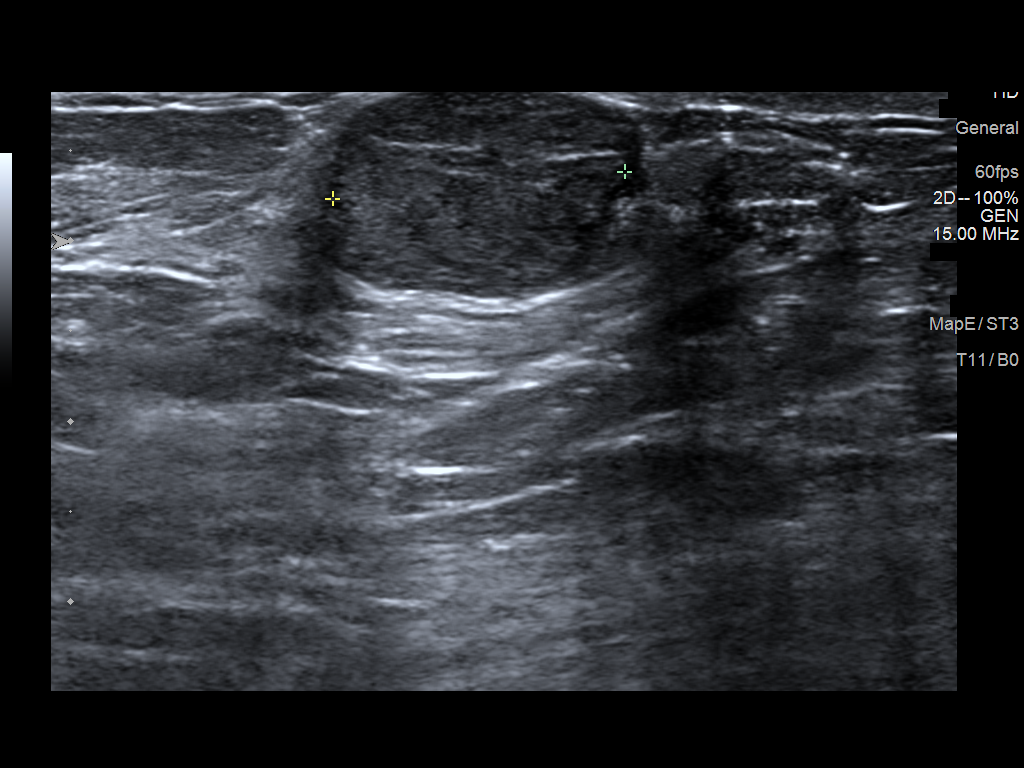
[im 4/7]
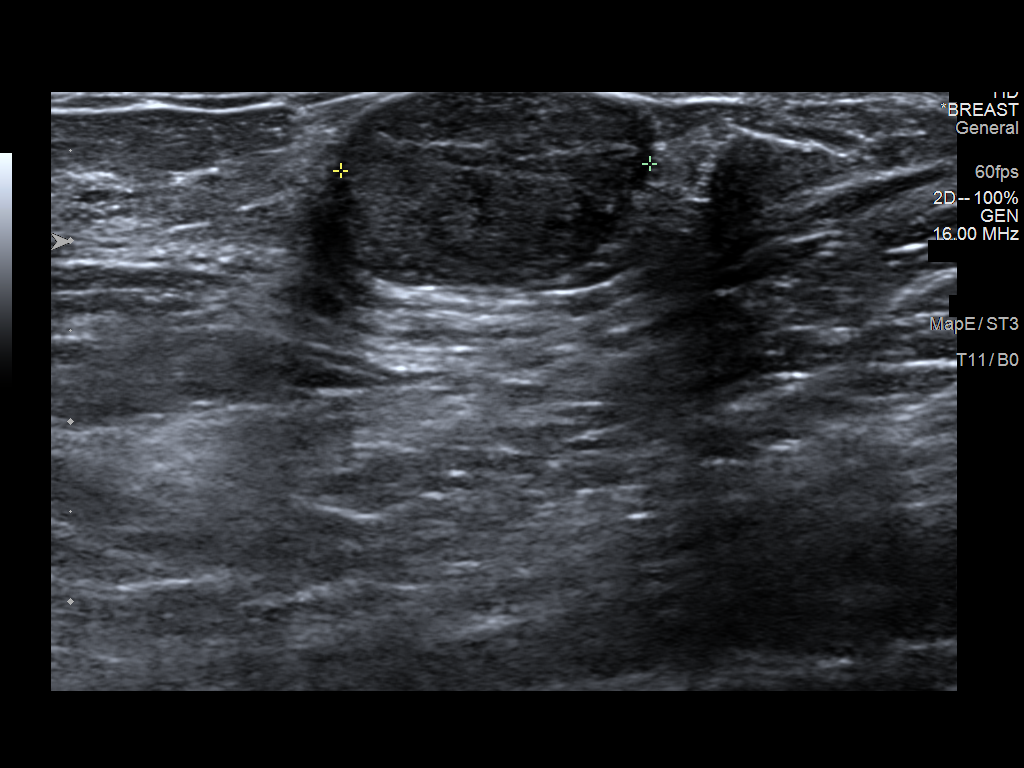
[im 5/7]
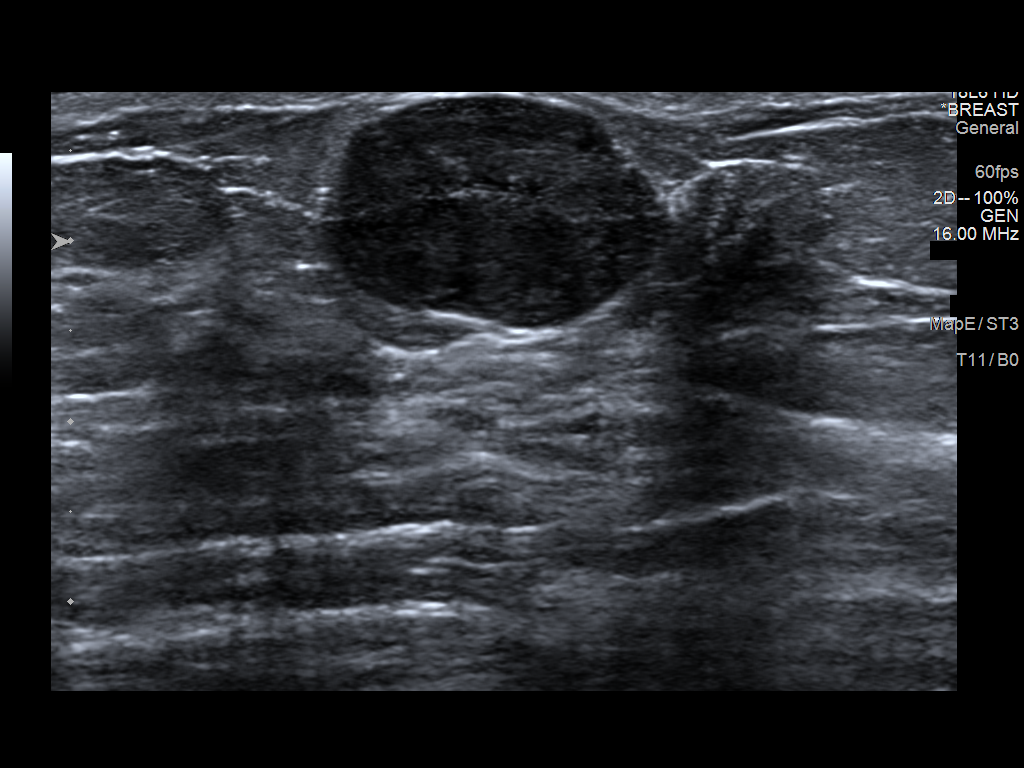
[im 6/7]
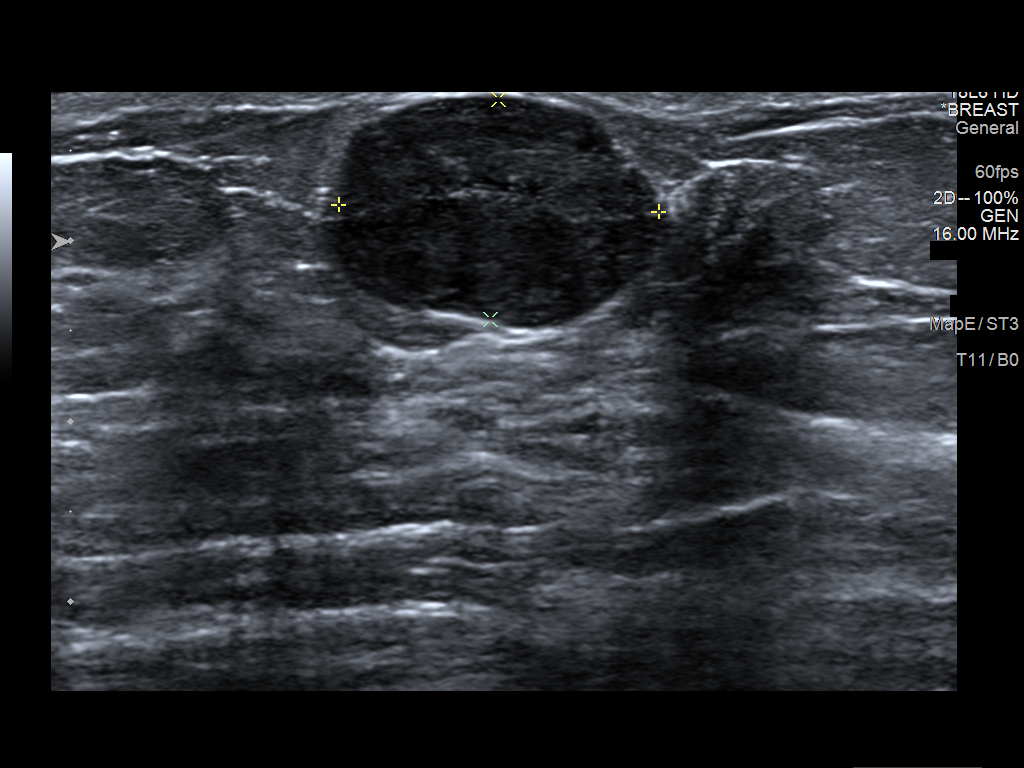
[im 7/7]
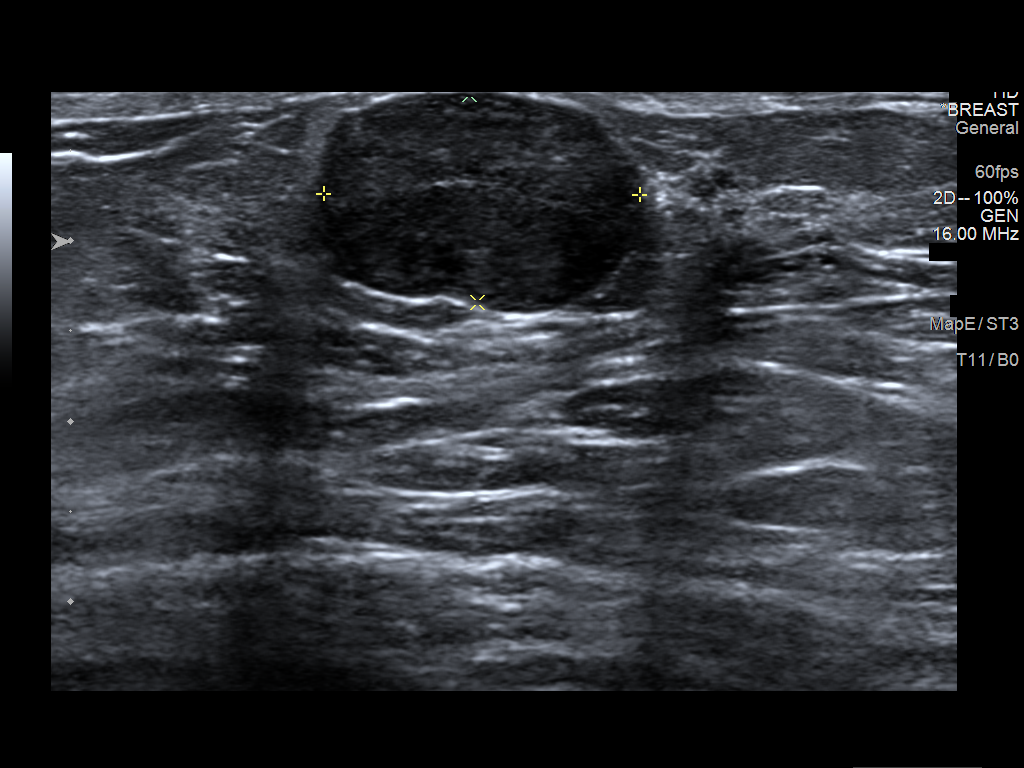

[7 of 7 positions shown; findings below may reference images not displayed]

ACR Breast Density Category c: The breast tissue is heterogeneously
dense, which may obscure small masses.
FINDINGS: Grossly unchanged oval circumscribed mass within the upper-outer
right breast. No additional concerning masses, calcifications or
distortion identified within the right or left breast.

Mammographic images were processed with CAD.

Targeted ultrasound is performed, showing a stable 1.6 x 1.7 x
cm oval circumscribed hypoechoic mass right breast 11 o'clock
position 2 cm from the nipple favored to represent a fibroadenoma.
IMPRESSION: Benign right breast mass 11 o'clock position favored to represent a
fibroadenoma given stability over time.

No mammographic evidence for malignancy.

RECOMMENDATION:
Screening mammogram in one year.(Code:[id number])

I have discussed the findings and recommendations with the patient.
If applicable, a reminder letter will be sent to the patient
regarding the next appointment.

BI-RADS CATEGORY  2: Benign.

## 2022-04-14 ENCOUNTER — Ambulatory Visit: Payer: Self-pay | Admitting: Family Medicine

## 2022-08-18 ENCOUNTER — Other Ambulatory Visit: Payer: Self-pay | Admitting: Family Medicine

## 2022-08-18 ENCOUNTER — Telehealth: Payer: Self-pay | Admitting: Family Medicine

## 2022-08-18 DIAGNOSIS — Z0000 Encounter for general adult medical examination without abnormal findings: Secondary | ICD-10-CM

## 2022-08-18 NOTE — Telephone Encounter (Signed)
Tommie Sams, DO    I like these medications but her insurance doesn't cover. Recommend follow up with bariatric surgeon.

## 2022-08-18 NOTE — Telephone Encounter (Signed)
Patient want opinion on weight loss was interested in medication for weight but her insurance does not cover it- has history of gastric bypass surgery

## 2022-08-18 NOTE — Telephone Encounter (Signed)
Patient stated she spoke with bariatric doctor and he wants her to have a lot of testing done to see is she has stretched out and her insurance wouldn't cover and high deductible so she was just wanting to speak with doctor about ways other than injectables to jump start her weight loss again. Patient scheduled office visit 09/01/22 to discuss

## 2022-08-18 NOTE — Telephone Encounter (Signed)
Patient would like opposition on weight loss because insurance will not pay for the medication.. She states talked a little about it at her last visit.

## 2022-09-01 ENCOUNTER — Ambulatory Visit: Payer: BC Managed Care – PPO | Admitting: Family Medicine

## 2022-09-01 VITALS — BP 114/58 | HR 77 | Temp 97.9°F | Ht <= 58 in | Wt 181.0 lb

## 2022-09-01 DIAGNOSIS — Z6837 Body mass index (BMI) 37.0-37.9, adult: Secondary | ICD-10-CM

## 2022-09-01 DIAGNOSIS — E669 Obesity, unspecified: Secondary | ICD-10-CM | POA: Diagnosis not present

## 2022-09-01 MED ORDER — PHENTERMINE HCL 37.5 MG PO CAPS: 37.5000 mg | ORAL_CAPSULE | ORAL | 0 refills | Status: DC

## 2022-09-01 NOTE — Progress Notes (Signed)
Subjective:  Patient ID: [name], female    DOB: 12-24-1975  Age: 47 y.o. MRN: [id number]  CC: Chief Complaint  Patient presents with   weight concern    Patient has hx of weight loss surgery , states has gained weight due to job changes and her insurance does not cover any weight loss medicine     HPI:  47 year old female with obesity presents for evaluation of the above.  Patient has had prior bariatric surgery, duodenal switch.  She states that she has slowly regained weight and is interested in ways to get her weight back down.  She has seen her bariatric surgeon who recommended UGI/SBFT.  Patient states that she is concerned about insurance coverage of this and therefore did not proceed.  Patient would like to discuss weight loss options today.  She states that her insurance company would not pay for GLP-1 medications.  Patient has a sedentary job and has not very physically active.  She recently started walking but then stopped due to time constraints.  Patient drinks approximately 2 Pepsi's a day, 150 cal each.  She has a fair amount of carbohydrates.  She eats a fair amount of protein.  She does not like eating vegetables.  Will discuss dietary changes as well as medication options today.  Will also discuss physical activity.  Patient Active Problem List   Diagnosis Date Noted   Mixed hyperlipidemia 10/12/2021   Vitamin D deficiency 10/12/2021   GERD (gastroesophageal reflux disease) 10/12/2021   Herpes labialis 10/12/2021   Obesity (BMI 30-39.9) 10/11/2021    Social Hx   Social History   Socioeconomic History   Marital status: Married    Spouse name: Not on file   Number of children: Not on file   Years of education: Not on file   Highest education level: Not on file  Occupational History   Not on file  Tobacco Use   Smoking status: Never   Smokeless tobacco: Never  Substance and Sexual Activity   Alcohol use: No   Drug use: No   Sexual activity:  Not on file  Other Topics Concern   Not on file  Social History Narrative   Not on file   Social Determinants of Health   Financial Resource Strain: Not on file  Food Insecurity: Not on file  Transportation Needs: Not on file  Physical Activity: Not on file  Stress: Not on file  Social Connections: Not on file    Review of Systems Per HPI  Objective:  BP (!) 114/58   Pulse 77   Temp 97.9 F (36.6 C)   Ht 4\' 10"  (1.473 m)   Wt 181 lb (82.1 kg)   SpO2 98%   BMI 37.83 kg/m      09/01/2022    3:32 PM 10/11/2021    8:40 AM 09/21/2016    9:00 PM  BP/Weight  Systolic BP 114 106 111  Diastolic BP 58 71 64  Wt. (Lbs) 181 177.8   BMI 37.83 kg/m2 37.16 kg/m2     Physical Exam Vitals and nursing note reviewed.  Constitutional:      Appearance: Normal appearance. She is obese.  HENT:     Head: Normocephalic and atraumatic.  Eyes:     General:        Right eye: No discharge.     Conjunctiva/sclera: Conjunctivae normal.  Neurological:     Mental Status: She is alert.  Psychiatric:  Mood and Affect: Mood normal.        Behavior: Behavior normal.     Lab Results  Component Value Date   WBC 12.2 (H) 09/21/2016   HGB 13.5 09/21/2016   HCT 40.4 09/21/2016   PLT 277 09/21/2016   GLUCOSE 175 (H) 09/21/2016   CHOL 221 (H) 03/06/2015   TRIG 180 (H) 03/06/2015   HDL 59 03/06/2015   LDLCALC 126 (H) 03/06/2015   ALT 24 09/21/2016   AST 21 09/21/2016   NA 143 09/21/2016   K 3.6 09/21/2016   CL 112 (H) 09/21/2016   CREATININE 1.11 (H) 09/21/2016   BUN 10 09/21/2016   CO2 20 (L) 09/21/2016   TSH 1.920 03/06/2015   HGBA1C 5.4 06/08/2015     Assessment & Plan:   Problem List Items Addressed This Visit       Other   Obesity (BMI 30-39.9) - Primary    Discussed medication options as well as dietary changes to promote calorie deficit and regular physical activity.  Trial of phentermine.      Relevant Medications   phentermine 37.5 MG capsule    Meds  ordered this encounter  Medications   phentermine 37.5 MG capsule    Sig: Take 1 capsule (37.5 mg total) by mouth every morning.    Dispense:  90 capsule    Refill:  0    Follow-up:  Return in about 3 months (around 12/02/2022).  Everlene Other DO [facility]

## 2022-09-01 NOTE — Assessment & Plan Note (Signed)
Discussed medication options as well as dietary changes to promote calorie deficit and regular physical activity.  Trial of phentermine.

## 2022-09-01 NOTE — Patient Instructions (Signed)
Medication as prescribed.  Follow up in 3 months.  Take care  Dr. Ali Mclaurin 

## 2022-09-04 ENCOUNTER — Ambulatory Visit
Admission: RE | Admit: 2022-09-04 | Discharge: 2022-09-04 | Disposition: A | Discharge status: Home / Self Care | Payer: BC Managed Care – PPO | Source: Ambulatory Visit | Attending: Family Medicine | Admitting: Family Medicine

## 2022-09-04 DIAGNOSIS — Z0000 Encounter for general adult medical examination without abnormal findings: Secondary | ICD-10-CM

## 2022-09-04 DIAGNOSIS — Z1231 Encounter for screening mammogram for malignant neoplasm of breast: Secondary | ICD-10-CM | POA: Diagnosis not present

## 2022-09-09 ENCOUNTER — Other Ambulatory Visit: Payer: Self-pay | Admitting: Family Medicine

## 2022-09-09 DIAGNOSIS — R928 Other abnormal and inconclusive findings on diagnostic imaging of breast: Secondary | ICD-10-CM

## 2022-09-09 DIAGNOSIS — Z6837 Body mass index (BMI) 37.0-37.9, adult: Secondary | ICD-10-CM | POA: Diagnosis not present

## 2022-09-09 DIAGNOSIS — R339 Retention of urine, unspecified: Secondary | ICD-10-CM | POA: Diagnosis not present

## 2022-09-09 DIAGNOSIS — Z01419 Encounter for gynecological examination (general) (routine) without abnormal findings: Secondary | ICD-10-CM | POA: Diagnosis not present

## 2022-09-09 DIAGNOSIS — N3281 Overactive bladder: Secondary | ICD-10-CM | POA: Diagnosis not present

## 2022-09-09 DIAGNOSIS — Z124 Encounter for screening for malignant neoplasm of cervix: Secondary | ICD-10-CM | POA: Diagnosis not present

## 2022-09-09 LAB — HM PAP SMEAR: HM Pap smear: NORMAL

## 2022-09-17 ENCOUNTER — Ambulatory Visit
Admission: RE | Admit: 2022-09-17 | Discharge: 2022-09-17 | Disposition: A | Discharge status: Home / Self Care | Payer: BC Managed Care – PPO | Source: Ambulatory Visit | Attending: Family Medicine | Admitting: Family Medicine

## 2022-09-17 ENCOUNTER — Other Ambulatory Visit: Payer: Self-pay | Admitting: Family Medicine

## 2022-09-17 DIAGNOSIS — N6315 Unspecified lump in the right breast, overlapping quadrants: Secondary | ICD-10-CM | POA: Diagnosis not present

## 2022-09-17 DIAGNOSIS — N6310 Unspecified lump in the right breast, unspecified quadrant: Secondary | ICD-10-CM

## 2022-09-17 DIAGNOSIS — N6001 Solitary cyst of right breast: Secondary | ICD-10-CM | POA: Diagnosis not present

## 2022-09-17 DIAGNOSIS — R928 Other abnormal and inconclusive findings on diagnostic imaging of breast: Secondary | ICD-10-CM

## 2022-09-29 ENCOUNTER — Other Ambulatory Visit: Payer: Self-pay | Admitting: Family Medicine

## 2022-09-29 ENCOUNTER — Ambulatory Visit: Admission: RE | Admit: 2022-09-29 | Payer: BC Managed Care – PPO | Source: Ambulatory Visit

## 2022-09-29 ENCOUNTER — Other Ambulatory Visit: Payer: BC Managed Care – PPO

## 2022-09-29 ENCOUNTER — Ambulatory Visit
Admission: RE | Admit: 2022-09-29 | Discharge: 2022-09-29 | Disposition: A | Discharge status: Home / Self Care | Payer: BC Managed Care – PPO | Source: Ambulatory Visit | Attending: Family Medicine | Admitting: Family Medicine

## 2022-09-29 DIAGNOSIS — N6310 Unspecified lump in the right breast, unspecified quadrant: Secondary | ICD-10-CM

## 2022-09-29 DIAGNOSIS — N6315 Unspecified lump in the right breast, overlapping quadrants: Secondary | ICD-10-CM | POA: Diagnosis not present

## 2022-09-29 DIAGNOSIS — D241 Benign neoplasm of right breast: Secondary | ICD-10-CM | POA: Diagnosis not present

## 2022-09-29 HISTORY — PX: BREAST BIOPSY: SHX20

## 2022-11-05 ENCOUNTER — Other Ambulatory Visit: Payer: Self-pay | Admitting: Family Medicine

## 2022-11-18 ENCOUNTER — Telehealth: Payer: Self-pay | Admitting: Family Medicine

## 2022-11-18 NOTE — Telephone Encounter (Signed)
Having stomach issues bloating,constipation with soft stools. Please advise it been going on a week.

## 2022-11-24 DIAGNOSIS — E43 Unspecified severe protein-calorie malnutrition: Secondary | ICD-10-CM | POA: Diagnosis not present

## 2022-11-24 DIAGNOSIS — E569 Vitamin deficiency, unspecified: Secondary | ICD-10-CM | POA: Diagnosis not present

## 2022-12-02 ENCOUNTER — Ambulatory Visit: Payer: BC Managed Care – PPO | Admitting: Family Medicine

## 2022-12-02 VITALS — BP 103/72 | HR 100 | Temp 97.9°F | Ht <= 58 in | Wt 171.8 lb

## 2022-12-02 DIAGNOSIS — E669 Obesity, unspecified: Secondary | ICD-10-CM

## 2022-12-02 DIAGNOSIS — R770 Abnormality of albumin: Secondary | ICD-10-CM | POA: Diagnosis not present

## 2022-12-02 DIAGNOSIS — B351 Tinea unguium: Secondary | ICD-10-CM

## 2022-12-02 DIAGNOSIS — E559 Vitamin D deficiency, unspecified: Secondary | ICD-10-CM

## 2022-12-02 DIAGNOSIS — D508 Other iron deficiency anemias: Secondary | ICD-10-CM

## 2022-12-02 MED ORDER — TERBINAFINE HCL 250 MG PO TABS: 250.0000 mg | ORAL_TABLET | Freq: Every day | ORAL | 0 refills | Status: DC

## 2022-12-02 MED ORDER — PHENTERMINE HCL 37.5 MG PO CAPS: 37.5000 mg | ORAL_CAPSULE | ORAL | 0 refills | Status: DC

## 2022-12-02 NOTE — Patient Instructions (Signed)
Meds sent in.  Follow up in 3 months.

## 2022-12-03 ENCOUNTER — Encounter: Payer: Self-pay | Admitting: Family Medicine

## 2022-12-03 DIAGNOSIS — R770 Abnormality of albumin: Secondary | ICD-10-CM | POA: Insufficient documentation

## 2022-12-03 DIAGNOSIS — B351 Tinea unguium: Secondary | ICD-10-CM | POA: Insufficient documentation

## 2022-12-03 DIAGNOSIS — D508 Other iron deficiency anemias: Secondary | ICD-10-CM

## 2022-12-03 DIAGNOSIS — D509 Iron deficiency anemia, unspecified: Secondary | ICD-10-CM | POA: Insufficient documentation

## 2022-12-03 NOTE — Assessment & Plan Note (Signed)
Continuing phentermine.

## 2022-12-03 NOTE — Assessment & Plan Note (Signed)
Advised to increase protein intake.

## 2022-12-03 NOTE — Assessment & Plan Note (Signed)
Advised that she needs vitamin D supplementation.

## 2022-12-03 NOTE — Assessment & Plan Note (Signed)
Treating with oral terbinafine.

## 2022-12-03 NOTE — Assessment & Plan Note (Signed)
Advised to take oral iron supplementation.  Recommended referral for IV iron.  Patient will consider.  She states that she previously had to pay $800 for this service.  She will reach out to her insurance company.

## 2022-12-03 NOTE — Progress Notes (Addendum)
Subjective:  Patient ID: [name], female    DOB: 1975-08-10  Age: 47 y.o. MRN: [id number]  CC: Follow up   HPI:  47 year old female presents for follow-up.  Patient has lost weight on phentermine.  She is happy with the progress.  She is interested in continuing this medication.  She has lost 10 pounds based on our scale.  Patient recently had labs done through bariatrics.  Has iron deficiency anemia.  She has not been taking her iron supplementation.  Low vitamin D.  Borderline low vitamin B12.  Albumin and protein low as well.  Will discuss these results today.  Additionally, patient reports that she has a spot on the toenail of one of her great toes.  She would like me to examine this today.  Patient Active Problem List   Diagnosis Date Noted   Low serum albumin 12/03/2022   Iron deficiency anemia 12/03/2022   Onychomycosis 12/03/2022   Mixed hyperlipidemia 10/12/2021   Vitamin D deficiency 10/12/2021   GERD (gastroesophageal reflux disease) 10/12/2021   Herpes labialis 10/12/2021   Obesity (BMI 30-39.9) 10/11/2021    Social Hx   Social History   Socioeconomic History   Marital status: Married    Spouse name: Not on file   Number of children: Not on file   Years of education: Not on file   Highest education level: Not on file  Occupational History   Not on file  Tobacco Use   Smoking status: Never   Smokeless tobacco: Never  Substance and Sexual Activity   Alcohol use: No   Drug use: No   Sexual activity: Not on file  Other Topics Concern   Not on file  Social History Narrative   Not on file   Social Determinants of Health   Financial Resource Strain: Not on file  Food Insecurity: Not on file  Transportation Needs: Not on file  Physical Activity: Not on file  Stress: Not on file  Social Connections: Unknown (07/30/2021)   Received from [facility]   Social Network    Social Network: Not on file    Review of  Systems Per HPI  Objective:  BP 103/72   Pulse 100   Temp 97.9 F (36.6 C)   Ht 4\' 10"  (1.473 m)   Wt 171 lb 12.8 oz (77.9 kg)   SpO2 98%   BMI 35.91 kg/m      12/02/2022    3:08 PM 09/01/2022    3:32 PM 10/11/2021    8:40 AM  BP/Weight  Systolic BP 103 114 106  Diastolic BP 72 58 71  Wt. (Lbs) 171.8 181 177.8  BMI 35.91 kg/m2 37.83 kg/m2 37.16 kg/m2    Physical Exam Constitutional:      Appearance: Normal appearance. She is obese.  HENT:     Head: Normocephalic and atraumatic.  Pulmonary:     Effort: Pulmonary effort is normal. No respiratory distress.  Feet:     Comments: Right great toe with onychomycosis. Neurological:     Mental Status: She is alert.  Psychiatric:        Mood and Affect: Mood normal.        Behavior: Behavior normal.     Lab Results  Component Value Date   WBC 12.2 (H) 09/21/2016   HGB 13.5 09/21/2016   HCT 40.4 09/21/2016   PLT 277 09/21/2016   GLUCOSE 175 (H) 09/21/2016   CHOL 221 (H) 03/06/2015   TRIG 180 (H)  03/06/2015   HDL 59 03/06/2015   LDLCALC 126 (H) 03/06/2015   ALT 24 09/21/2016   AST 21 09/21/2016   NA 143 09/21/2016   K 3.6 09/21/2016   CL 112 (H) 09/21/2016   CREATININE 1.11 (H) 09/21/2016   BUN 10 09/21/2016   CO2 20 (L) 09/21/2016   TSH 1.920 03/06/2015   HGBA1C 5.4 06/08/2015     Assessment & Plan:   Problem List Items Addressed This Visit       Musculoskeletal and Integument   Onychomycosis - Primary    Treating with oral terbinafine.      Relevant Medications   terbinafine (LAMISIL) 250 MG tablet     Other   Vitamin D deficiency    Advised that she needs vitamin D supplementation.       Obesity (BMI 30-39.9)    Continuing phentermine.      Relevant Medications   phentermine 37.5 MG capsule   Low serum albumin    Advised to increase protein intake.      Iron deficiency anemia    Advised to take oral iron supplementation.  Recommended referral for IV iron.  Patient will consider.  She  states that she previously had to pay $800 for this service.  She will reach out to her insurance company.       Meds ordered this encounter  Medications   phentermine 37.5 MG capsule    Sig: Take 1 capsule (37.5 mg total) by mouth every morning.    Dispense:  90 capsule    Refill:  0   terbinafine (LAMISIL) 250 MG tablet    Sig: Take 1 tablet (250 mg total) by mouth daily.    Dispense:  90 tablet    Refill:  0    Follow-up:  3 months  Viann Nielson Adriana Simas DO [facility]

## 2023-01-08 NOTE — Telephone Encounter (Signed)
Brandy Sams, DO     Put the referral in please. Dx: Iron deficiency anemia

## 2023-01-09 ENCOUNTER — Other Ambulatory Visit: Payer: Self-pay | Admitting: Family Medicine

## 2023-01-12 ENCOUNTER — Other Ambulatory Visit: Payer: Self-pay | Admitting: Family Medicine

## 2023-01-12 MED ORDER — TOPIRAMATE 25 MG PO TABS: 25.0000 mg | ORAL_TABLET | Freq: Every day | ORAL | 2 refills | Status: DC

## 2023-01-16 ENCOUNTER — Encounter: Payer: BC Managed Care – PPO | Admitting: Oncology

## 2023-01-16 ENCOUNTER — Encounter: Payer: Self-pay | Admitting: Oncology

## 2023-01-16 ENCOUNTER — Inpatient Hospital Stay: Payer: BC Managed Care – PPO

## 2023-01-16 ENCOUNTER — Inpatient Hospital Stay: Payer: BC Managed Care – PPO | Admitting: Oncology

## 2023-01-16 ENCOUNTER — Other Ambulatory Visit: Payer: BC Managed Care – PPO

## 2023-01-16 ENCOUNTER — Inpatient Hospital Stay: Payer: BC Managed Care – PPO | Attending: Oncology | Admitting: Oncology

## 2023-01-16 VITALS — BP 114/77 | HR 97 | Temp 98.6°F | Resp 16 | Ht <= 58 in | Wt 168.9 lb

## 2023-01-16 DIAGNOSIS — D509 Iron deficiency anemia, unspecified: Secondary | ICD-10-CM | POA: Insufficient documentation

## 2023-01-16 DIAGNOSIS — Z1211 Encounter for screening for malignant neoplasm of colon: Secondary | ICD-10-CM | POA: Insufficient documentation

## 2023-01-16 DIAGNOSIS — D508 Other iron deficiency anemias: Secondary | ICD-10-CM

## 2023-01-16 DIAGNOSIS — Z79899 Other long term (current) drug therapy: Secondary | ICD-10-CM | POA: Diagnosis not present

## 2023-01-16 DIAGNOSIS — N920 Excessive and frequent menstruation with regular cycle: Secondary | ICD-10-CM | POA: Insufficient documentation

## 2023-01-16 DIAGNOSIS — Z9884 Bariatric surgery status: Secondary | ICD-10-CM | POA: Insufficient documentation

## 2023-01-16 MED ORDER — FERROUS SULFATE 325 (65 FE) MG PO TBEC: 325.0000 mg | DELAYED_RELEASE_TABLET | ORAL | 3 refills | Status: AC

## 2023-01-16 NOTE — Assessment & Plan Note (Addendum)
History of duodenal switch/gastric bypass surgery with inconsistent iron supplementation and also reports menorrhagia. Recent labs show low iron levels. Patient reports fatigue. -Change iron supplementation to 65mg  elemental every other day. -Advise to take stool softeners when constipated. -Start IV iron infusions. 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 The patient is symptomatic from anemia and the iron level is critically low. She tolerated oral iron supplement poorly and desires to achieved higher levels of iron faster for adequate hematopoesis. Some of the side-effects to be expected including risks of infusion reactions, phlebitis, headaches, nausea and fatigue. The patient is willing to proceed. Patient education material was dispensed. Goal is to keep ferritin level greater than 50 and resolution of anemia  -Recheck labs in one month to assess response to treatment.

## 2023-01-16 NOTE — Patient Instructions (Signed)
VISIT SUMMARY:  During today's visit, we discussed your recent fatigue, heavy menstrual cycles, and changes in bowel movements. We reviewed your history of duodenal switch surgery and your recent resumption of iron and vitamin B12 supplements. We also talked about possible symptoms of perimenopause.  YOUR PLAN:  -IRON DEFICIENCY ANEMIA: Iron deficiency anemia occurs when your body doesn't have enough iron to produce adequate levels of hemoglobin, which can cause fatigue. We will change your iron supplementation to 65mg  every other day and start you on IV iron infusions, with two doses one week apart. We will recheck your labs in one month to see how you are responding to the treatment.  -CONSTIPATION: Constipation is when you have infrequent or difficult bowel movements. You should continue taking stool softeners when constipated and consider using Miralax if needed.  -COLON CANCER SCREENING: Colon cancer screening is important for early detection of colon cancer. Since you are 47 years old and have not had a colonoscopy, we will send a referral for you to get one.  -MENORRHAGIA: Menorrhagia is heavy menstrual bleeding. If your iron deficiency anemia does not improve with the current treatment plan, follow with GYN for further evaluation of your heavy menstrual cycles.  INSTRUCTIONS:  Please follow up in one month to reassess your iron levels and response to the treatment. Additionally, ensure you attend the colonoscopy appointment once it is scheduled.

## 2023-01-16 NOTE — Assessment & Plan Note (Signed)
Patient reports heavy menstrual bleeding. -Consider further evaluation if iron deficiency anemia does not improve with current plan.

## 2023-01-16 NOTE — Progress Notes (Signed)
[name] at [facility] HEMATOLOGY NEW VISIT  Brandy Sams, DO  REASON FOR REFERRAL: Iron deficiency anemia  HISTORY OF PRESENT ILLNESS: [name] 47 y.o. female referred for iron deficiency anemia. She has a past medical history of iron deficiency, gastric bypass/duodenal switch and obesity.She admits to being inconsistent with her iron and vitamin B12 supplements, having gone about eight months without taking them. She has recently resumed taking these supplements for about forty-five days. She also reports heavy menstrual cycles, changing her pad every hour for the first couple of days. She has noticed changes in her bowel movements, with periods of constipation and bloating. She has been taking stool softeners and a fiber supplement to manage this. The patient also mentions possible symptoms of perimenopause, including changes in temperature and vision.  She is a non-smoker, does not use alcohol.  Lives in [place] and works with an Economist in [employer].  She has no family history of cancer.   I have reviewed the past medical history, past surgical history, social history and family history with the patient   ALLERGIES:  has No Known Allergies.  MEDICATIONS:  Current Outpatient Medications  Medication Sig Dispense Refill   acyclovir ointment (ZOVIRAX) 5 % Apply 5 times daily. 30 g 1   Biotin (BIOTIN MAXIMUM STRENGTH) 10 MG TABS Take by mouth.     Cyanocobalamin (B-12 PO) Take 1 tablet by mouth daily.     Docusate Sodium (DSS) 100 MG CAPS Take by mouth.     Fe Bisgly-Vit C-Vit B12-FA (GENTLE IRON) 28-60-0.008-0.4 MG CAPS Take 2 tablets by mouth in the morning and at bedtime.     ferrous sulfate 325 (65 FE) MG EC tablet Take 1 tablet (325 mg total) by mouth every other day. 45 tablet 3   MAGNESIUM PO Take by mouth.     Multiple Vitamin (MULTIVITAMIN) capsule Take 1 capsule by mouth daily.     omeprazole (PRILOSEC) 20 MG capsule TAKE ONE  CAPSULE BY MOUTH ONCE DAILY. 30 capsule 11   phentermine 37.5 MG capsule Take 1 capsule (37.5 mg total) by mouth every morning. 90 capsule 0   terbinafine (LAMISIL) 250 MG tablet Take 1 tablet (250 mg total) by mouth daily. 90 tablet 0   topiramate (TOPAMAX) 25 MG tablet Take 1 tablet (25 mg total) by mouth daily. 30 tablet 2   valACYclovir (VALTREX) 1000 MG tablet TAKE 1 TABLET BY MOUTH TWICE DAILY. 60 tablet 0   Vitamin A 2400 MCG (8000 UT) CAPS Take by mouth.     VITAMIN D PO Take by mouth.     zinc gluconate 3.75 mg/mL SOLN Take by mouth.     No current facility-administered medications for this visit.     REVIEW OF SYSTEMS:   Constitutional: Denies fevers, chills or night sweats Eyes: Denies blurriness of vision Ears, nose, mouth, throat, and face: Denies mucositis or sore throat Respiratory: Denies cough, dyspnea or wheezes Cardiovascular: Denies palpitation, chest discomfort or lower extremity swelling Gastrointestinal:  Denies nausea, heartburn or change in bowel habits Skin: Denies abnormal skin rashes Lymphatics: Denies new lymphadenopathy or easy bruising Neurological:Denies numbness, tingling or new weaknesses Behavioral/Psych: Mood is stable, no new changes  All other systems were reviewed with the patient and are negative.  PHYSICAL EXAMINATION:   Vitals:   01/16/23 1337  BP: 114/77  Pulse: 97  Resp: 16  Temp: 98.6 F (37 C)  SpO2: 100%    GENERAL:alert, no distress and  comfortable SKIN: skin color, texture, turgor are normal, no rashes or significant lesions LUNGS: clear to auscultation and percussion with normal breathing effort HEART: regular rate & rhythm and no murmurs and no lower extremity edema ABDOMEN:abdomen soft, non-tender and normal bowel sounds Musculoskeletal:no cyanosis of digits and no clubbing  NEURO: alert & oriented x 3 with fluent speech   LABORATORY DATA:  I have reviewed the data as listed labs in the Care Everywhere Labs from  12/02/2022: Iron: 32, ferritin: 4, TIBC: 458, percent sat: 7 Vitamin B12: 267  Lab Results  Component Value Date   WBC 12.2 (H) 09/21/2016   NEUTROABS 9.6 (H) 09/21/2016   HGB 13.5 09/21/2016   HCT 40.4 09/21/2016   MCV 85.6 09/21/2016   PLT 277 09/21/2016      Component Value Date/Time   NA 143 09/21/2016 1752   NA 142 03/06/2015 1008   K 3.6 09/21/2016 1752   CL 112 (H) 09/21/2016 1752   CO2 20 (L) 09/21/2016 1752   GLUCOSE 175 (H) 09/21/2016 1752   BUN 10 09/21/2016 1752   BUN 9 03/06/2015 1008   CREATININE 1.11 (H) 09/21/2016 1752   CALCIUM 9.5 09/21/2016 1752   PROT 6.9 09/21/2016 1752   PROT 6.5 03/06/2015 1008   ALBUMIN 3.8 09/21/2016 1752   ALBUMIN 3.8 03/06/2015 1008   AST 21 09/21/2016 1752   ALT 24 09/21/2016 1752   ALKPHOS 78 09/21/2016 1752   BILITOT 0.9 09/21/2016 1752   BILITOT <0.2 03/06/2015 1008   GFRNONAA >60 09/21/2016 1752   GFRAA >60 09/21/2016 1752       Chemistry      Component Value Date/Time   NA 143 09/21/2016 1752   NA 142 03/06/2015 1008   K 3.6 09/21/2016 1752   CL 112 (H) 09/21/2016 1752   CO2 20 (L) 09/21/2016 1752   BUN 10 09/21/2016 1752   BUN 9 03/06/2015 1008   CREATININE 1.11 (H) 09/21/2016 1752      Component Value Date/Time   CALCIUM 9.5 09/21/2016 1752   ALKPHOS 78 09/21/2016 1752   AST 21 09/21/2016 1752   ALT 24 09/21/2016 1752   BILITOT 0.9 09/21/2016 1752   BILITOT <0.2 03/06/2015 1008       ASSESSMENT & PLAN:  Patient is a 47 year old female with past medical history of duodenal switch and menorrhagia presented today for iron deficiency anemia and possible IV iron infusion   Iron deficiency anemia History of duodenal switch/gastric bypass surgery with inconsistent iron supplementation and also reports menorrhagia. Recent labs show low iron levels. Patient reports fatigue. -Change iron supplementation to 65mg  elemental every other day. -Advise to take stool softeners when constipated. -Start IV iron  infusions. 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 The patient is symptomatic from anemia and the iron level is critically low. She tolerated oral iron supplement poorly and desires to achieved higher levels of iron faster for adequate hematopoesis. Some of the side-effects to be expected including risks of infusion reactions, phlebitis, headaches, nausea and fatigue. The patient is willing to proceed. Patient education material was dispensed. Goal is to keep ferritin level greater than 50 and resolution of anemia  -Recheck labs in one month to assess response to treatment.   Menorrhagia Patient reports heavy menstrual bleeding. -Consider further evaluation if iron deficiency anemia does not improve with current plan.   Colon cancer screening Patient is 47  years old and has not had a colonoscopy. -Send referral for colonoscopy.   Orders Placed This Encounter  Procedures   CBC with Differential/Platelet    Standing Status:   Future    Standing Expiration Date:   01/16/2024   Ferritin    Standing Status:   Future    Standing Expiration Date:   01/16/2024   Vitamin B12    Standing Status:   Future    Standing Expiration Date:   01/16/2024   Iron and TIBC    Standing Status:   Future    Standing Expiration Date:   01/16/2024    The total time spent in the appointment was 30 minutes encounter with patients including review of chart and various tests results, discussions about plan of care and coordination of care plan   All questions were answered. The patient knows to call the clinic with any problems, questions or concerns. No barriers to learning was detected.   Cindie Crumbly, MD 11/1/20242:42 PM

## 2023-01-16 NOTE — Assessment & Plan Note (Signed)
Patient is 47 years old and has not had a colonoscopy. -Send referral for colonoscopy.

## 2023-01-20 ENCOUNTER — Encounter: Payer: Self-pay | Admitting: *Deleted

## 2023-01-22 ENCOUNTER — Inpatient Hospital Stay: Payer: BC Managed Care – PPO

## 2023-01-22 VITALS — BP 120/68 | HR 100 | Temp 98.0°F | Resp 18 | Ht <= 58 in | Wt 167.2 lb

## 2023-01-22 DIAGNOSIS — Z79899 Other long term (current) drug therapy: Secondary | ICD-10-CM | POA: Diagnosis not present

## 2023-01-22 DIAGNOSIS — D508 Other iron deficiency anemias: Secondary | ICD-10-CM

## 2023-01-22 DIAGNOSIS — N920 Excessive and frequent menstruation with regular cycle: Secondary | ICD-10-CM | POA: Diagnosis not present

## 2023-01-22 DIAGNOSIS — Z9884 Bariatric surgery status: Secondary | ICD-10-CM | POA: Diagnosis not present

## 2023-01-22 DIAGNOSIS — D509 Iron deficiency anemia, unspecified: Secondary | ICD-10-CM | POA: Diagnosis not present

## 2023-01-22 MED ORDER — DIPHENHYDRAMINE HCL 25 MG PO CAPS
25.0000 mg | ORAL_CAPSULE | Freq: Once | ORAL | Status: AC
  Administered 2023-01-22: 25 mg via ORAL
  Filled 2023-01-22: qty 1

## 2023-01-22 MED ORDER — SODIUM CHLORIDE 0.9 % IV SOLN
510.0000 mg | Freq: Once | INTRAVENOUS | Status: AC
  Administered 2023-01-22: 510 mg via INTRAVENOUS
  Filled 2023-01-22: qty 510

## 2023-01-22 MED ORDER — SODIUM CHLORIDE 0.9 % IV SOLN: INTRAVENOUS | Status: DC

## 2023-01-22 MED ORDER — ACETAMINOPHEN 325 MG PO TABS
650.0000 mg | ORAL_TABLET | Freq: Once | ORAL | Status: AC
  Administered 2023-01-22: 650 mg via ORAL
  Filled 2023-01-22: qty 2

## 2023-01-22 MED ORDER — SODIUM CHLORIDE 0.9% FLUSH
10.0000 mL | Freq: Two times a day (BID) | INTRAVENOUS | Status: DC
Start: 2023-01-22 — End: 2023-01-22

## 2023-01-22 NOTE — Progress Notes (Signed)
Patient presents today for iron infusion.  Patient is in satisfactory condition with no new complaints voiced.  Vital signs are stable.  IV placed in L arm.  IV flushed well with good blood return noted.  We will proceed with infusion per provider orders.    Patient tolerated iron infusion well with no complaints voiced.  Patient left ambulatory in stable condition.  Vital signs stable at discharge.  Follow up as scheduled.

## 2023-01-22 NOTE — Patient Instructions (Signed)
[facility]  Discharge Instructions: Thank you for choosing  Cancer Center to provide your oncology and hematology care.  If you have a lab appointment with the Cancer Center - please note that after April 8th, 2024, all labs will be drawn in the cancer center.  You do not have to check in or register with the main entrance as you have in the past but will complete your check-in in the cancer center.  Wear comfortable clothing and clothing appropriate for easy access to any Portacath or PICC line.   We strive to give you quality time with your provider. You may need to reschedule your appointment if you arrive late (15 or more minutes).  Arriving late affects you and other patients whose appointments are after yours.  Also, if you miss three or more appointments without notifying the office, you may be dismissed from the clinic at the provider's discretion.      For prescription refill requests, have your pharmacy contact our office and allow 72 hours for refills to be completed.    Today you received the following:  Feraheme.  Ferumoxytol Injection What is this medication? FERUMOXYTOL (FER ue MOX i tol) treats low levels of iron in your body (iron deficiency anemia). Iron is a mineral that plays an important role in making red blood cells, which carry oxygen from your lungs to the rest of your body. This medicine may be used for other purposes; ask your health care provider or pharmacist if you have questions. COMMON BRAND NAME(S): Feraheme What should I tell my care team before I take this medication? They need to know if you have any of these conditions: Anemia not caused by low iron levels High levels of iron in the blood Magnetic resonance imaging (MRI) test scheduled An unusual or allergic reaction to iron, other medications, foods, dyes, or preservatives Pregnant or trying to get pregnant Breastfeeding How should I  use this medication? This medication is injected into a vein. It is given by your care team in a hospital or clinic setting. Talk to your care team the use of this medication in children. Special care may be needed. Overdosage: If you think you have taken too much of this medicine contact a poison control center or emergency room at once. NOTE: This medicine is only for you. Do not share this medicine with others. What if I miss a dose? It is important not to miss your dose. Call your care team if you are unable to keep an appointment. What may interact with this medication? Other iron products This list may not describe all possible interactions. Give your health care provider a list of all the medicines, herbs, non-prescription drugs, or dietary supplements you use. Also tell them if you smoke, drink alcohol, or use illegal drugs. Some items may interact with your medicine. What should I watch for while using this medication? Visit your care team regularly. Tell your care team if your symptoms do not start to get better or if they get worse. You may need blood work done while you are taking this medication. You may need to follow a special diet. Talk to your care team. Foods that contain iron include: whole grains/cereals, dried fruits, beans, or peas, leafy green vegetables, and organ meats (liver, kidney). What side effects may I notice from receiving this medication? Side effects that you should report to your care team as soon as possible:  Allergic reactions--skin rash, itching, hives, swelling of the face, lips, tongue, or throat Low blood pressure--dizziness, feeling faint or lightheaded, blurry vision Shortness of breath Side effects that usually do not require medical attention (report to your care team if they continue or are bothersome): Flushing Headache Joint pain Muscle pain Nausea Pain, redness, or irritation at injection site This list may not describe all possible side  effects. Call your doctor for medical advice about side effects. You may report side effects to FDA at 1-800-FDA-1088. Where should I keep my medication? This medication is given in a hospital or clinic. It will not be stored at home. NOTE: This sheet is a summary. It may not cover all possible information. If you have questions about this medicine, talk to your doctor, pharmacist, or health care provider.  2024 Elsevier/Gold Standard (2022-08-08 00:00:00)     To help prevent nausea and vomiting after your treatment, we encourage you to take your nausea medication as directed.  BELOW ARE SYMPTOMS THAT SHOULD BE REPORTED IMMEDIATELY: *FEVER GREATER THAN 100.4 F (38 C) OR HIGHER *CHILLS OR SWEATING *NAUSEA AND VOMITING THAT IS NOT CONTROLLED WITH YOUR NAUSEA MEDICATION *UNUSUAL SHORTNESS OF BREATH *UNUSUAL BRUISING OR BLEEDING *URINARY PROBLEMS (pain or burning when urinating, or frequent urination) *BOWEL PROBLEMS (unusual diarrhea, constipation, pain near the anus) TENDERNESS IN MOUTH AND THROAT WITH OR WITHOUT PRESENCE OF ULCERS (sore throat, sores in mouth, or a toothache) UNUSUAL RASH, SWELLING OR PAIN  UNUSUAL VAGINAL DISCHARGE OR ITCHING   Items with * indicate a potential emergency and should be followed up as soon as possible or go to the Emergency Department if any problems should occur.  Please show the CHEMOTHERAPY ALERT CARD or IMMUNOTHERAPY ALERT CARD at check-in to the Emergency Department and triage nurse.  Should you have questions after your visit or need to cancel or reschedule your appointment, please contact [place] [facility] - A DEPT OF [facility] [phone]  and follow the prompts.  Office hours are 8:00 a.m. to 4:30 p.m. Monday - Friday. Please note that voicemails left after 4:00 p.m. may not be returned until the following business day.  We are closed weekends and major holidays. You have access to a nurse at all times for urgent  questions. Please call the main number to the clinic [phone] and follow the prompts.  For any non-urgent questions, you may also contact your provider using MyChart. We now offer e-Visits for anyone 79 and older to request care online for non-urgent symptoms. For details visit [email or web address].   Also download the MyChart app! Go to the app store, search "MyChart", open the app, select , and log in with your MyChart username and password.

## 2023-01-29 ENCOUNTER — Inpatient Hospital Stay: Payer: BC Managed Care – PPO

## 2023-01-29 VITALS — BP 115/73 | HR 92 | Temp 98.0°F | Resp 18

## 2023-01-29 DIAGNOSIS — D509 Iron deficiency anemia, unspecified: Secondary | ICD-10-CM | POA: Diagnosis not present

## 2023-01-29 DIAGNOSIS — Z9884 Bariatric surgery status: Secondary | ICD-10-CM | POA: Diagnosis not present

## 2023-01-29 DIAGNOSIS — Z79899 Other long term (current) drug therapy: Secondary | ICD-10-CM | POA: Diagnosis not present

## 2023-01-29 DIAGNOSIS — N920 Excessive and frequent menstruation with regular cycle: Secondary | ICD-10-CM | POA: Diagnosis not present

## 2023-01-29 DIAGNOSIS — D508 Other iron deficiency anemias: Secondary | ICD-10-CM

## 2023-01-29 MED ORDER — SODIUM CHLORIDE 0.9 % IV SOLN
510.0000 mg | Freq: Once | INTRAVENOUS | Status: AC
  Administered 2023-01-29: 510 mg via INTRAVENOUS
  Filled 2023-01-29: qty 510

## 2023-01-29 MED ORDER — SODIUM CHLORIDE 0.9 % IV SOLN: INTRAVENOUS | Status: DC

## 2023-01-29 MED ORDER — ACETAMINOPHEN 325 MG PO TABS
650.0000 mg | ORAL_TABLET | Freq: Once | ORAL | Status: AC
  Administered 2023-01-29: 650 mg via ORAL
  Filled 2023-01-29: qty 2

## 2023-01-29 MED ORDER — CETIRIZINE HCL 10 MG PO TABS
10.0000 mg | ORAL_TABLET | Freq: Once | ORAL | Status: AC
  Administered 2023-01-29: 10 mg via ORAL
  Filled 2023-01-29: qty 1

## 2023-01-29 NOTE — Patient Instructions (Signed)
 [place] [facility]  Discharge Instructions: Thank you for choosing [facility] to provide your oncology and hematology care.  If you have a lab appointment with the Cancer Center - please note that after April 8th, 2024, all labs will be drawn in the cancer center.  You do not have to check in or register with the main entrance as you have in the past but will complete your check-in in the cancer center.  Wear comfortable clothing and clothing appropriate for easy access to any Portacath or PICC line.   We strive to give you quality time with your provider. You may need to reschedule your appointment if you arrive late (15 or more minutes).  Arriving late affects you and other patients whose appointments are after yours.  Also, if you miss three or more appointments without notifying the office, you may be dismissed from the clinic at the provider's discretion.      For prescription refill requests, have your pharmacy contact our office and allow 72 hours for refills to be completed.    Today you received Feraheme IV iron infusion.   BELOW ARE SYMPTOMS THAT SHOULD BE REPORTED IMMEDIATELY: *FEVER GREATER THAN 100.4 F (38 C) OR HIGHER *CHILLS OR SWEATING *NAUSEA AND VOMITING THAT IS NOT CONTROLLED WITH YOUR NAUSEA MEDICATION *UNUSUAL SHORTNESS OF BREATH *UNUSUAL BRUISING OR BLEEDING *URINARY PROBLEMS (pain or burning when urinating, or frequent urination) *BOWEL PROBLEMS (unusual diarrhea, constipation, pain near the anus) TENDERNESS IN MOUTH AND THROAT WITH OR WITHOUT PRESENCE OF ULCERS (sore throat, sores in mouth, or a toothache) UNUSUAL RASH, SWELLING OR PAIN  UNUSUAL VAGINAL DISCHARGE OR ITCHING   Items with * indicate a potential emergency and should be followed up as soon as possible or go to the Emergency Department if any problems should occur.  Please show the CHEMOTHERAPY ALERT CARD or IMMUNOTHERAPY ALERT CARD at  check-in to the Emergency Department and triage nurse.  Should you have questions after your visit or need to cancel or reschedule your appointment, please contact [place] [facility] - A DEPT OF [facility] [phone]  and follow the prompts.  Office hours are 8:00 a.m. to 4:30 p.m. Monday - Friday. Please note that voicemails left after 4:00 p.m. may not be returned until the following business day.  We are closed weekends and major holidays. You have access to a nurse at all times for urgent questions. Please call the main number to the clinic [phone] and follow the prompts.  For any non-urgent questions, you may also contact your provider using MyChart. We now offer e-Visits for anyone 32 and older to request care online for non-urgent symptoms. For details visit [email or web address].   Also download the MyChart app! Go to the app store, search "MyChart", open the app, select [place], and log in with your MyChart username and password.

## 2023-01-29 NOTE — Progress Notes (Signed)
Patient presents today for iron infusion. Patient is in satisfactory condition with no new complaints voiced.  Vital signs are stable.  We will proceed with infusion per provider orders.    Peripheral IV started with good blood return pre and post infusion.  Feraheme 510 mg given today per MD orders. Tolerated infusion without adverse affects. Vital signs stable. No complaints at this time. Discharged from clinic ambulatory in stable condition. Alert and oriented x 3. F/U with [facility] as scheduled.

## 2023-02-10 ENCOUNTER — Inpatient Hospital Stay: Payer: BC Managed Care – PPO

## 2023-02-10 ENCOUNTER — Telehealth: Payer: Self-pay | Admitting: *Deleted

## 2023-02-10 DIAGNOSIS — D508 Other iron deficiency anemias: Secondary | ICD-10-CM

## 2023-02-10 DIAGNOSIS — Z9884 Bariatric surgery status: Secondary | ICD-10-CM | POA: Diagnosis not present

## 2023-02-10 DIAGNOSIS — N920 Excessive and frequent menstruation with regular cycle: Secondary | ICD-10-CM | POA: Diagnosis not present

## 2023-02-10 DIAGNOSIS — Z79899 Other long term (current) drug therapy: Secondary | ICD-10-CM | POA: Diagnosis not present

## 2023-02-10 DIAGNOSIS — D509 Iron deficiency anemia, unspecified: Secondary | ICD-10-CM | POA: Diagnosis not present

## 2023-02-10 LAB — CBC WITH DIFFERENTIAL/PLATELET
Abs Immature Granulocytes: 0.01 10*3/uL (ref 0.00–0.07)
Basophils Absolute: 0 10*3/uL (ref 0.0–0.1)
Basophils Relative: 1 %
Eosinophils Absolute: 0.1 10*3/uL (ref 0.0–0.5)
Eosinophils Relative: 1 %
HCT: 44.5 % (ref 36.0–46.0)
Hemoglobin: 14.7 g/dL (ref 12.0–15.0)
Immature Granulocytes: 0 %
Lymphocytes Relative: 26 %
Lymphs Abs: 2 10*3/uL (ref 0.7–4.0)
MCH: 30.2 pg (ref 26.0–34.0)
MCHC: 33 g/dL (ref 30.0–36.0)
MCV: 91.4 fL (ref 80.0–100.0)
Monocytes Absolute: 0.5 10*3/uL (ref 0.1–1.0)
Monocytes Relative: 7 %
Neutro Abs: 4.8 10*3/uL (ref 1.7–7.7)
Neutrophils Relative %: 65 %
Platelets: 291 10*3/uL (ref 150–400)
RBC: 4.87 MIL/uL (ref 3.87–5.11)
RDW: 14.1 % (ref 11.5–15.5)
WBC: 7.4 10*3/uL (ref 4.0–10.5)
nRBC: 0 % (ref 0.0–0.2)

## 2023-02-10 LAB — IRON AND TIBC
Iron: 93 ug/dL (ref 28–170)
Saturation Ratios: 29 % (ref 10.4–31.8)
TIBC: 321 ug/dL (ref 250–450)
UIBC: 228 ug/dL

## 2023-02-10 LAB — VITAMIN B12: Vitamin B-12: 593 pg/mL (ref 180–914)

## 2023-02-10 LAB — FERRITIN: Ferritin: 239 ng/mL (ref 11–307)

## 2023-02-10 NOTE — Telephone Encounter (Signed)
Tommie Sams, DO     Treatment is for 3 months. Shouldn't need additional meds other than that.

## 2023-02-10 NOTE — Telephone Encounter (Signed)
Patient's insurance will only pay for 84 pills in 180 days- so patient just ended up paying cash for the last 6 tablets( insurance would only pay for 30 tablets at a time instead of the 90 day supply

## 2023-02-10 NOTE — Telephone Encounter (Signed)
Received PA for Terbinafine 250mg  for patient - I see this was prescribed in September- should patient still be on this?

## 2023-02-18 ENCOUNTER — Inpatient Hospital Stay: Payer: BC Managed Care – PPO | Attending: Oncology | Admitting: Oncology

## 2023-02-18 VITALS — BP 121/75 | HR 105 | Temp 96.3°F | Resp 20 | Wt 166.0 lb

## 2023-02-18 DIAGNOSIS — Z79899 Other long term (current) drug therapy: Secondary | ICD-10-CM | POA: Insufficient documentation

## 2023-02-18 DIAGNOSIS — N920 Excessive and frequent menstruation with regular cycle: Secondary | ICD-10-CM | POA: Insufficient documentation

## 2023-02-18 DIAGNOSIS — D649 Anemia, unspecified: Secondary | ICD-10-CM | POA: Diagnosis not present

## 2023-02-18 DIAGNOSIS — E611 Iron deficiency: Secondary | ICD-10-CM | POA: Insufficient documentation

## 2023-02-18 NOTE — Progress Notes (Signed)
[facility] at [facility] HEMATOLOGY FOLLOW-UP VISIT  Brandy Sams, DO  REASON FOR FOLLOW-UP: Iron deficiency anemia  ASSESSMENT & PLAN:  Patient is a 47 year old female with past medical history of duodenal switch and menorrhagia is here for follow-up for iron deficiency anemia   Iron deficiency Patient has iron deficiency without anemia.Significant improvement in energy levels following IV iron infusions. Ferritin increased from 4 to 239 and iron saturation ratio improved from 7 to 20. -Continue oral iron supplementation every other day  -Follow-up with GYN for menorrhagia and GI for screening colonoscopy -Follow-up in 3 months to reassess iron levels and discuss outcomes of OBGYN visit and colonoscopy.    Menorrhagia Reports of heavy bleeding during menstrual cycle, particularly on days 2 and 3. No recent consultation with OBGYN. -Plan to discuss heavy bleeding with OBGYN during next visit.   Orders Placed This Encounter  Procedures   Ferritin    Standing Status:   Future    Standing Expiration Date:   02/18/2024   Folate    Standing Status:   Future    Standing Expiration Date:   02/18/2024   Vitamin B12    Standing Status:   Future    Standing Expiration Date:   02/18/2024   Comprehensive metabolic panel    Standing Status:   Future    Standing Expiration Date:   02/18/2024   CBC with Differential/Platelet    Standing Status:   Future    Standing Expiration Date:   02/18/2024   Iron and TIBC    Standing Status:   Future    Standing Expiration Date:   02/18/2024    Order Specific Question:   Release to patient    Answer:   Immediate [1]    The total time spent in the appointment was 20 minutes encounter with patients including review of chart and various tests results, discussions about plan of care and coordination of care plan   All questions were answered. The patient knows to call the clinic with any problems, questions or concerns. No  barriers to learning was detected.  Cindie Crumbly, MD 12/4/20249:03 AM   SUMMARY OF HEMATOLOGIC HISTORY: Labs from 12/02/2022: Iron: 32, ferritin: 4, TIBC: 458, percent sat: 7 Vitamin B12: 267  -s/p Feraheme 510mg  01/22/23 and 01/29/23   INTERVAL HISTORY: [name] 47 y.o. female is here for follow-up for iron deficiency after IV iron infusions.She reports feeling 'significantly' better with increased energy and less fatigue. She has been taking oral iron supplements every other day and vitamin B12 supplements, which she had at home. She also reports heavy menstrual bleeding, particularly on the second and third days of her cycle, but has not yet discussed this with her OBGYN. She has not yet scheduled a colonoscopy due to insurance concerns and plans to do so in the new year.   She has no other complaints today.  Has been doing really well. I have reviewed the past medical history, past surgical history, social history and family history with the patient   ALLERGIES:  has No Known Allergies.  MEDICATIONS:  Current Outpatient Medications  Medication Sig Dispense Refill   acyclovir ointment (ZOVIRAX) 5 % Apply 5 times daily. 30 g 1   Biotin (BIOTIN MAXIMUM STRENGTH) 10 MG TABS Take by mouth.     Cyanocobalamin (B-12 PO) Take 1 tablet by mouth daily.     Docusate Sodium (DSS) 100 MG CAPS Take by mouth.     Fe  Bisgly-Vit C-Vit B12-FA (GENTLE IRON) 28-60-0.008-0.4 MG CAPS Take 2 tablets by mouth in the morning and at bedtime.     ferrous sulfate 325 (65 FE) MG EC tablet Take 1 tablet (325 mg total) by mouth every other day. 45 tablet 3   MAGNESIUM PO Take by mouth.     Multiple Vitamin (MULTIVITAMIN) capsule Take 1 capsule by mouth daily.     omeprazole (PRILOSEC) 20 MG capsule TAKE ONE CAPSULE BY MOUTH ONCE DAILY. 30 capsule 11   phentermine 37.5 MG capsule Take 1 capsule (37.5 mg total) by mouth every morning. 90 capsule 0   terbinafine (LAMISIL) 250 MG tablet Take 1  tablet (250 mg total) by mouth daily. 90 tablet 0   topiramate (TOPAMAX) 25 MG tablet Take 1 tablet (25 mg total) by mouth daily. 30 tablet 2   valACYclovir (VALTREX) 1000 MG tablet TAKE 1 TABLET BY MOUTH TWICE DAILY. 60 tablet 0   Vitamin A 2400 MCG (8000 UT) CAPS Take by mouth.     VITAMIN D PO Take by mouth.     zinc gluconate 3.75 mg/mL SOLN Take by mouth.     No current facility-administered medications for this visit.     REVIEW OF SYSTEMS:   Constitutional: Denies fevers, chills or night sweats Eyes: Denies blurriness of vision Ears, nose, mouth, throat, and face: Denies mucositis or sore throat Respiratory: Denies cough, dyspnea or wheezes Cardiovascular: Denies palpitation, chest discomfort or lower extremity swelling Gastrointestinal:  Denies nausea, heartburn or change in bowel habits Skin: Denies abnormal skin rashes Lymphatics: Denies new lymphadenopathy or easy bruising Neurological:Denies numbness, tingling or new weaknesses Behavioral/Psych: Mood is stable, no new changes  All other systems were reviewed with the patient and are negative.  PHYSICAL EXAMINATION:   Vitals:   02/18/23 0840  BP: 121/75  Pulse: (!) 105  Resp: 20  Temp: (!) 96.3 F (35.7 C)  SpO2: 99%    GENERAL:alert, no distress and comfortable LUNGS: clear to auscultation and percussion with normal breathing effort HEART: regular rate & rhythm and no murmurs and no lower extremity edema ABDOMEN:abdomen soft, non-tender and normal bowel sounds Musculoskeletal:no cyanosis of digits and no clubbing  NEURO: alert & oriented x 3 with fluent speech  LABORATORY DATA:  I have reviewed the data as listed  Lab Results  Component Value Date   WBC 7.4 02/10/2023   NEUTROABS 4.8 02/10/2023   HGB 14.7 02/10/2023   HCT 44.5 02/10/2023   MCV 91.4 02/10/2023   PLT 291 02/10/2023      Component Value Date/Time   NA 143 09/21/2016 1752   NA 142 03/06/2015 1008   K 3.6 09/21/2016 1752   CL 112 (H)  09/21/2016 1752   CO2 20 (L) 09/21/2016 1752   GLUCOSE 175 (H) 09/21/2016 1752   BUN 10 09/21/2016 1752   BUN 9 03/06/2015 1008   CREATININE 1.11 (H) 09/21/2016 1752   CALCIUM 9.5 09/21/2016 1752   PROT 6.9 09/21/2016 1752   PROT 6.5 03/06/2015 1008   ALBUMIN 3.8 09/21/2016 1752   ALBUMIN 3.8 03/06/2015 1008   AST 21 09/21/2016 1752   ALT 24 09/21/2016 1752   ALKPHOS 78 09/21/2016 1752   BILITOT 0.9 09/21/2016 1752   BILITOT <0.2 03/06/2015 1008   GFRNONAA >60 09/21/2016 1752   GFRAA >60 09/21/2016 1752       Chemistry      Component Value Date/Time   NA 143 09/21/2016 1752   NA 142 03/06/2015 1008  K 3.6 09/21/2016 1752   CL 112 (H) 09/21/2016 1752   CO2 20 (L) 09/21/2016 1752   BUN 10 09/21/2016 1752   BUN 9 03/06/2015 1008   CREATININE 1.11 (H) 09/21/2016 1752      Component Value Date/Time   CALCIUM 9.5 09/21/2016 1752   ALKPHOS 78 09/21/2016 1752   AST 21 09/21/2016 1752   ALT 24 09/21/2016 1752   BILITOT 0.9 09/21/2016 1752   BILITOT <0.2 03/06/2015 1008      Latest Reference Range & Units 02/10/23 10:46  Iron 28 - 170 ug/dL 93  UIBC ug/dL 295  TIBC 621 - 308 ug/dL 657  Saturation Ratios 10.4 - 31.8 % 29  Ferritin 11 - 307 ng/mL 239    Latest Reference Range & Units 02/10/23 10:46  Vitamin B12 180 - 914 pg/mL 593

## 2023-02-18 NOTE — Assessment & Plan Note (Signed)
Reports of heavy bleeding during menstrual cycle, particularly on days 2 and 3. No recent consultation with OBGYN. -Plan to discuss heavy bleeding with OBGYN during next visit.

## 2023-02-18 NOTE — Patient Instructions (Signed)
VISIT SUMMARY:  You had a follow-up appointment today to check on your iron deficiency anemia after receiving iron infusions. You mentioned feeling significantly better with increased energy and less fatigue. We also discussed your heavy menstrual bleeding and the need to schedule a colonoscopy.  YOUR PLAN:  -IRON DEFICIENCY ANEMIA: Iron deficiency anemia is a condition where your body lacks enough iron to produce healthy red blood cells. Your energy levels have improved significantly after the IV iron infusions. Your ferritin levels have increased from 4 to 239, and your iron saturation ratio has improved from 7 to 20. Please continue taking your oral iron supplements every other day for the next 3 months and continue with your Vitamin B12 supplements.  -MENORRHAGIA: Menorrhagia is heavy menstrual bleeding. You reported heavy bleeding on the second and third days of your cycle. Please discuss this with your OBGYN during your next visit.  -COLONOSCOPY: A colonoscopy is a procedure to examine the inside of your colon. You have not scheduled this yet due to insurance concerns. Please schedule the colonoscopy after confirming your insurance coverage.  INSTRUCTIONS:  Please follow up in 3 months to reassess your iron levels and discuss the outcomes of your OBGYN visit and colonoscopy.

## 2023-02-18 NOTE — Assessment & Plan Note (Signed)
Patient has iron deficiency without anemia.Significant improvement in energy levels following IV iron infusions. Ferritin increased from 4 to 239 and iron saturation ratio improved from 7 to 20. -Continue oral iron supplementation every other day  -Follow-up with GYN for menorrhagia and GI for screening colonoscopy -Follow-up in 3 months to reassess iron levels and discuss outcomes of OBGYN visit and colonoscopy.

## 2023-03-03 ENCOUNTER — Ambulatory Visit: Payer: BC Managed Care – PPO | Admitting: Family Medicine

## 2023-03-05 ENCOUNTER — Ambulatory Visit: Payer: BC Managed Care – PPO | Admitting: Family Medicine

## 2023-03-05 ENCOUNTER — Other Ambulatory Visit: Payer: Self-pay | Admitting: Family Medicine

## 2023-03-16 ENCOUNTER — Ambulatory Visit: Payer: BC Managed Care – PPO | Admitting: Family Medicine

## 2023-03-20 ENCOUNTER — Ambulatory Visit: Payer: BC Managed Care – PPO | Admitting: Nurse Practitioner

## 2023-03-23 ENCOUNTER — Other Ambulatory Visit: Payer: Self-pay | Admitting: Family Medicine

## 2023-04-02 ENCOUNTER — Ambulatory Visit: Payer: BC Managed Care – PPO | Admitting: Nurse Practitioner

## 2023-04-12 ENCOUNTER — Other Ambulatory Visit: Payer: Self-pay | Admitting: Family Medicine

## 2023-04-13 ENCOUNTER — Other Ambulatory Visit: Payer: Self-pay

## 2023-04-13 MED ORDER — TOPIRAMATE 25 MG PO TABS: 25.0000 mg | ORAL_TABLET | Freq: Every day | ORAL | 2 refills | Status: AC

## 2023-04-17 ENCOUNTER — Ambulatory Visit: Payer: BC Managed Care – PPO | Admitting: Nurse Practitioner

## 2023-04-17 VITALS — BP 121/82 | HR 95 | Temp 98.2°F | Ht <= 58 in | Wt 165.0 lb

## 2023-04-17 DIAGNOSIS — R5383 Other fatigue: Secondary | ICD-10-CM | POA: Diagnosis not present

## 2023-04-17 DIAGNOSIS — K219 Gastro-esophageal reflux disease without esophagitis: Secondary | ICD-10-CM

## 2023-04-17 DIAGNOSIS — B351 Tinea unguium: Secondary | ICD-10-CM

## 2023-04-17 DIAGNOSIS — Z6835 Body mass index (BMI) 35.0-35.9, adult: Secondary | ICD-10-CM

## 2023-04-17 DIAGNOSIS — E669 Obesity, unspecified: Secondary | ICD-10-CM

## 2023-04-17 DIAGNOSIS — Z79899 Other long term (current) drug therapy: Secondary | ICD-10-CM | POA: Diagnosis not present

## 2023-04-18 ENCOUNTER — Encounter: Payer: Self-pay | Admitting: Nurse Practitioner

## 2023-04-18 LAB — HEPATIC FUNCTION PANEL
ALT: 59 [IU]/L — ABNORMAL HIGH (ref 0–32)
AST: 33 [IU]/L (ref 0–40)
Albumin: 4.1 g/dL (ref 3.9–4.9)
Alkaline Phosphatase: 137 [IU]/L — ABNORMAL HIGH (ref 44–121)
Bilirubin Total: <0.2 mg/dL (ref 0.0–1.2)
Bilirubin, Direct: <0.08 mg/dL (ref 0.00–0.40)
Total Protein: 6.5 g/dL (ref 6.0–8.5)

## 2023-04-18 LAB — TSH: TSH: 1.91 u[IU]/mL (ref 0.450–4.500)

## 2023-04-18 LAB — T4, FREE: Free T4: 1.12 ng/dL (ref 0.82–1.77)

## 2023-04-19 ENCOUNTER — Encounter: Payer: Self-pay | Admitting: Nurse Practitioner

## 2023-04-19 NOTE — Progress Notes (Signed)
Subjective:    Patient ID: [name], female    DOB: [birth year], 48 y.o.   MRN: [id number]  HPI Presents for routine follow-up.  Currently on topiramate and phentermine for weight loss.  Denies any adverse effects.  Tries eat a healthy diet and stay as active as possible.  Her weight has been stable.  Her ultimate weight loss goal is 145 pounds Followed by hematology for anemia.  History of duodenal switch. Also patient request a recheck of her toenail, seen for onychomycosis back in September.  Has taken 3 months of Lamisil.  Denies any adverse effects. GERD is stable with occasional use of omeprazole 20 mg. Gets regular gynecologic exams.  Last Pap smear June 2024.  Review of Systems  Constitutional:  Positive for fatigue.  HENT:  Negative for sore throat and trouble swallowing.   Respiratory:  Negative for cough, chest tightness and shortness of breath.   Cardiovascular:  Negative for chest pain.      04/17/2023    2:17 PM  Depression screen PHQ 2/9  Decreased Interest 0  Down, Depressed, Hopeless 0  PHQ - 2 Score 0  Altered sleeping 0  Tired, decreased energy 0  Change in appetite 0  Feeling bad or failure about yourself  0  Trouble concentrating 0  Moving slowly or fidgety/restless 0  Suicidal thoughts 0  PHQ-9 Score 0      04/17/2023    2:17 PM 12/02/2022    3:20 PM 09/01/2022    3:41 PM  GAD 7 : Generalized Anxiety Score  Nervous, Anxious, on Edge 0 0 0  Control/stop worrying 0 0 0  Worry too much - different things 0 0 0  Trouble relaxing 0 0 0  Restless 0 0 0  Easily annoyed or irritable 0 0 0  Afraid - awful might happen 0 0 0  Total GAD 7 Score 0 0 0  Anxiety Difficulty   Not difficult at all         Objective:   Physical Exam NAD.  Alert, oriented.  Lungs clear.  Heart regular rate rhythm.  Abdomen soft nondistended nontender.  Right great toe slightly pinkish in color but otherwise no discoloration.  No thickened nail.  Minimal  separation of the end of the nail from the nailbed. Today's Vitals   04/17/23 1414  BP: 121/82  Pulse: 95  Temp: 98.2 F (36.8 C)  SpO2: 98%  Weight: 165 lb (74.8 kg)  Height: 4' 9.5" (1.461 m)   Body mass index is 35.09 kg/m.        Assessment & Plan:   Problem List Items Addressed This Visit       Digestive   GERD (gastroesophageal reflux disease) - Primary     Musculoskeletal and Integument   Onychomycosis     Other   Obesity (BMI 30-39.9)   Other Visit Diagnoses       Fatigue, unspecified type       Relevant Orders   T4, free (Completed)   TSH (Completed)     High risk medication use       Relevant Orders   Hepatic function panel (Completed)        Recheck TSH and free T4.  Recheck hepatic function due to use of Lamisil.  Encourage patient to switch to essential oils such as tea tree oil topically.  She has 1 more prescription of Lamisil which she plans to take. Continue healthy diet regular activity and weight loss efforts.  Continue follow-up with hematology. Return in about 3 months (around 07/15/2023).

## 2023-04-20 ENCOUNTER — Other Ambulatory Visit: Payer: Self-pay | Admitting: Nurse Practitioner

## 2023-04-20 ENCOUNTER — Encounter: Payer: Self-pay | Admitting: Nurse Practitioner

## 2023-04-20 DIAGNOSIS — R7401 Elevation of levels of liver transaminase levels: Secondary | ICD-10-CM

## 2023-04-20 MED ORDER — CICLOPIROX 8 % EX SOLN: Freq: Every day | CUTANEOUS | 2 refills | Status: AC

## 2023-05-20 ENCOUNTER — Inpatient Hospital Stay: Payer: BC Managed Care – PPO

## 2023-05-27 ENCOUNTER — Inpatient Hospital Stay: Payer: BC Managed Care – PPO | Admitting: Oncology

## 2023-06-10 ENCOUNTER — Inpatient Hospital Stay

## 2023-06-17 ENCOUNTER — Inpatient Hospital Stay: Admitting: Oncology

## 2023-07-15 ENCOUNTER — Ambulatory Visit: Payer: BC Managed Care – PPO | Admitting: Family Medicine

## 2023-08-19 ENCOUNTER — Other Ambulatory Visit: Payer: Self-pay | Admitting: Family Medicine

## 2023-08-19 DIAGNOSIS — Z1231 Encounter for screening mammogram for malignant neoplasm of breast: Secondary | ICD-10-CM

## 2023-09-09 ENCOUNTER — Ambulatory Visit

## 2023-09-25 ENCOUNTER — Encounter: Payer: Self-pay | Admitting: Family Medicine

## 2023-09-25 ENCOUNTER — Other Ambulatory Visit: Payer: Self-pay

## 2023-09-25 ENCOUNTER — Other Ambulatory Visit: Payer: Self-pay | Admitting: Family Medicine

## 2023-09-25 MED ORDER — TRIAMCINOLONE ACETONIDE 0.1 % EX CREA: 1.0000 | TOPICAL_CREAM | Freq: Two times a day (BID) | CUTANEOUS | 0 refills | Status: AC

## 2023-09-25 NOTE — Telephone Encounter (Signed)
 Nurses Please send in the prescription Triamcinolone  0.1% cream apply thin amount twice daily, 45 g She may use this twice daily over the course of the next 7 to 10 days if it does not clear up I recommend follow-up with Dr. Bluford thanks-Dr. Glendia

## 2023-09-28 ENCOUNTER — Other Ambulatory Visit: Payer: Self-pay

## 2023-09-28 MED ORDER — VALACYCLOVIR HCL 1 G PO TABS: 1000.0000 mg | ORAL_TABLET | Freq: Two times a day (BID) | ORAL | 0 refills | Status: AC

## 2023-10-12 ENCOUNTER — Ambulatory Visit

## 2023-10-15 ENCOUNTER — Ambulatory Visit

## 2023-10-21 DIAGNOSIS — Z6838 Body mass index (BMI) 38.0-38.9, adult: Secondary | ICD-10-CM | POA: Diagnosis not present

## 2023-10-21 DIAGNOSIS — Z01419 Encounter for gynecological examination (general) (routine) without abnormal findings: Secondary | ICD-10-CM | POA: Diagnosis not present

## 2023-10-29 ENCOUNTER — Ambulatory Visit
Admission: RE | Admit: 2023-10-29 | Discharge: 2023-10-29 | Disposition: A | Discharge status: Home / Self Care | Source: Ambulatory Visit | Attending: Family Medicine | Admitting: Family Medicine

## 2023-10-29 DIAGNOSIS — Z1231 Encounter for screening mammogram for malignant neoplasm of breast: Secondary | ICD-10-CM

## 2024-01-07 DIAGNOSIS — N939 Abnormal uterine and vaginal bleeding, unspecified: Secondary | ICD-10-CM | POA: Diagnosis not present

## 2024-01-14 DIAGNOSIS — N920 Excessive and frequent menstruation with regular cycle: Secondary | ICD-10-CM | POA: Diagnosis not present

## 2024-01-20 ENCOUNTER — Ambulatory Visit: Payer: Self-pay

## 2024-01-20 NOTE — Telephone Encounter (Signed)
 FYI Only or Action Required?: FYI only for provider: appointment scheduled on 11.6.25.  Patient was last seen in primary care on 04/17/2023 by Brandy Elveria BROCKS, NP.  Called Nurse Triage reporting Menstrual Problem and Shortness of Breath.  Symptoms began several weeks ago.  Interventions attempted: Prescription medications: obgyn prescribed medication, Rest, hydration, or home remedies, and Other: iron.  Symptoms are: unchanged.  Triage Disposition: See PCP When Office is Open (Within 3 Days)  Patient/caregiver understands and will follow disposition?: Yes    Copied from CRM #[id number]. Topic: Clinical - Red Word Triage >> Jan 20, 2024  8:45 AM [name] wrote: Red Word that prompted transfer to Nurse Triage: Extended menstrual cycle, very concerned. Leg swelling on Thursday, had shortness of breath, could hear blood rushing through her veins in her ears. Reason for Disposition  Bleeding with > 6 soaked pads or tampons per day  Answer Assessment - Initial Assessment Questions Pt thought she was going through perimenopause. Her OBGYN did thyroid  and hormone testing and US  which showed cyst on left ovary. She has been on this cycle for 26 days. She has bled through size 5 pads within an hour or 2 some days and bleeding through her clothes. She states OBGYN gave options like hormones or ablation, she does not want to do hormones because her gma had breast ca. She states this week she has noticed some additional things and thinks they are side effects of the blood loss. She has leg swelling bilaterally from the knees down. Shortness of breath with any exertion. She states that she carried her lunch box and purse out the car and felt winded. Her heart is beating fasting and she can hear the swooshing sound of blood/pulse in her ears.  She states she has started her Iron back up. She had had to have an iron infusion in the past. She states she is passing clots that are bigger than a quarter  daily. She has also had headaches this past week. Pt had some scheduling request. RN was able to meet those, gave pt instructions on when to go to the ER. She stated understanding.     1. BLEEDING SEVERITY: Describe the bleeding that you are having. How much bleeding is there?      Severe,  2. ONSET: When did the bleeding begin? Is it continuing now?     26 days ago 3. MENOPAUSE: When was your last menstrual period?      Currently on it 4. ABDOMEN PAIN: Do you have any pain? How bad is the pain?  (e.g., Scale 0-10; none, mild, moderate, or severe)     Abdominal cramping 5. BLOOD THINNERS: Do you take any blood thinners? (e.g., Coumadin/warfarin, Pradaxa/dabigatran, aspirin)     no 6. HORMONE MEDICINES: Are you taking any hormone medicines, prescription or OTC? (e.g., birth control pills, estrogen)     no 7. CAUSE: What do you think is causing the bleeding? (e.g., recent gyn surgery, recent gyn procedure; known bleeding disorder, uterine cancer)       unknown 8. HEMODYNAMIC STATUS: Are you weak or feeling lightheaded? If Yes, ask: Can you stand and walk normally?       No symptoms of dizziness 9. OTHER SYMPTOMS: What other symptoms are you having with the bleeding? (e.g., back pain, burning with urination, fever)     Leg swelling, shortness of breath, increased HR  Protocols used: Vaginal Bleeding - Postmenopausal-A-AH

## 2024-01-21 ENCOUNTER — Ambulatory Visit: Payer: Self-pay | Admitting: Nurse Practitioner

## 2024-01-21 ENCOUNTER — Encounter: Payer: Self-pay | Admitting: Nurse Practitioner

## 2024-01-21 VITALS — BP 112/74 | HR 97 | Temp 98.2°F | Ht <= 58 in | Wt 187.0 lb

## 2024-01-21 DIAGNOSIS — R5383 Other fatigue: Secondary | ICD-10-CM | POA: Diagnosis not present

## 2024-01-21 DIAGNOSIS — R600 Localized edema: Secondary | ICD-10-CM | POA: Diagnosis not present

## 2024-01-21 DIAGNOSIS — R0602 Shortness of breath: Secondary | ICD-10-CM

## 2024-01-21 DIAGNOSIS — N939 Abnormal uterine and vaginal bleeding, unspecified: Secondary | ICD-10-CM

## 2024-01-21 DIAGNOSIS — D508 Other iron deficiency anemias: Secondary | ICD-10-CM | POA: Diagnosis not present

## 2024-01-21 MED ORDER — MEGESTROL ACETATE 40 MG PO TABS: ORAL_TABLET | ORAL | 0 refills | Status: AC

## 2024-01-21 NOTE — Progress Notes (Unsigned)
   Subjective:    Patient ID: [name], female    DOB: 06-21-1975, 48 y.o.   MRN: [id number]  HPI  Pt is reports lower extremity swelling from the knee down She has been by gyn for irregular cycles She hears a pounding in her ears at times , has DOE Would like to check an anemia panel or heart All other labs normal    Review of Systems  Constitutional:  Positive for fatigue.  Respiratory:  Positive for shortness of breath. Negative for cough and wheezing.   Cardiovascular:  Positive for leg swelling.  Genitourinary:  Positive for menstrual problem.  Skin:  Positive for pallor.  Neurological:  Negative for light-headedness.       Objective:   Physical Exam Cardiovascular:     Rate and Rhythm: Normal rate and regular rhythm.     Heart sounds: Normal heart sounds.  Pulmonary:     Effort: Pulmonary effort is normal.     Breath sounds: Normal breath sounds.  Musculoskeletal:     Right lower leg: Edema present.     Left lower leg: Edema present.     Comments: 1+ edema BLE  Skin:    Coloration: Skin is pale.  Neurological:     Mental Status: She is oriented to person, place, and time.  Psychiatric:        Mood and Affect: Mood normal.        Behavior: Behavior normal.           Assessment & Plan:

## 2024-01-22 ENCOUNTER — Ambulatory Visit: Payer: Self-pay

## 2024-01-22 ENCOUNTER — Encounter: Payer: Self-pay | Admitting: Nurse Practitioner

## 2024-01-22 ENCOUNTER — Telehealth: Payer: Self-pay

## 2024-01-22 DIAGNOSIS — D508 Other iron deficiency anemias: Secondary | ICD-10-CM

## 2024-01-22 DIAGNOSIS — N939 Abnormal uterine and vaginal bleeding, unspecified: Secondary | ICD-10-CM

## 2024-01-22 NOTE — Telephone Encounter (Signed)
 Patient has been informed per provider result notes below . Pt verbalized understanding .   Please contact patient this am. As we suspected, she is extremely anemia with very low iron stores. Needs urgent referral to hematology at [facility]. She has had iron infusions there before. Make sure she has started the Megace for her bleeding and contact me early next week if no improvement.  The last test may take some time but this is the most likely cause for her symptoms. She has low protein levels most likely associated with the anemia which can cause edema. Thanks!

## 2024-01-23 ENCOUNTER — Encounter: Payer: Self-pay | Admitting: Nurse Practitioner

## 2024-01-23 LAB — CBC WITH DIFFERENTIAL/PLATELET
Basophils Absolute: 0 x10E3/uL (ref 0.0–0.2)
Basos: 1 %
EOS (ABSOLUTE): 0.2 x10E3/uL (ref 0.0–0.4)
Eos: 3 %
Hematocrit: 21.1 % — ABNORMAL LOW (ref 34.0–46.6)
Hemoglobin: 6.7 g/dL — CL (ref 11.1–15.9)
Immature Grans (Abs): 0 x10E3/uL (ref 0.0–0.1)
Immature Granulocytes: 0 %
Lymphocytes Absolute: 1.7 x10E3/uL (ref 0.7–3.1)
Lymphs: 24 %
MCH: 28.5 pg (ref 26.6–33.0)
MCHC: 31.8 g/dL (ref 31.5–35.7)
MCV: 90 fL (ref 79–97)
Monocytes Absolute: 0.6 x10E3/uL (ref 0.1–0.9)
Monocytes: 8 %
Neutrophils Absolute: 4.6 x10E3/uL (ref 1.4–7.0)
Neutrophils: 64 %
Platelets: 419 x10E3/uL (ref 150–450)
RBC: 2.35 x10E6/uL — CL (ref 3.77–5.28)
RDW: 13.1 % (ref 11.7–15.4)
WBC: 7.2 x10E3/uL (ref 3.4–10.8)

## 2024-01-23 LAB — IRON,TIBC AND FERRITIN PANEL
Ferritin: 8 ng/mL — ABNORMAL LOW (ref 15–150)
Iron Saturation: 4 % — CL (ref 15–55)
Iron: 17 ug/dL — ABNORMAL LOW (ref 27–159)
Total Iron Binding Capacity: 400 ug/dL (ref 250–450)
UIBC: 383 ug/dL (ref 131–425)

## 2024-01-23 LAB — COMPREHENSIVE METABOLIC PANEL WITH GFR
ALT: 11 IU/L (ref 0–32)
AST: 12 IU/L (ref 0–40)
Albumin: 3.6 g/dL — ABNORMAL LOW (ref 3.9–4.9)
Alkaline Phosphatase: 111 IU/L (ref 41–116)
BUN/Creatinine Ratio: 12 (ref 9–23)
BUN: 9 mg/dL (ref 6–24)
Bilirubin Total: <0.2 mg/dL (ref 0.0–1.2)
CO2: 21 mmol/L (ref 20–29)
Calcium: 8.3 mg/dL — ABNORMAL LOW (ref 8.7–10.2)
Chloride: 106 mmol/L (ref 96–106)
Creatinine, Ser: 0.76 mg/dL (ref 0.57–1.00)
Globulin, Total: 2 g/dL (ref 1.5–4.5)
Glucose: 84 mg/dL (ref 70–99)
Potassium: 4.1 mmol/L (ref 3.5–5.2)
Sodium: 142 mmol/L (ref 134–144)
Total Protein: 5.6 g/dL — ABNORMAL LOW (ref 6.0–8.5)
eGFR: 97 mL/min/1.73 (ref >59)

## 2024-01-23 LAB — BRAIN NATRIURETIC PEPTIDE: BNP: 49.9 pg/mL (ref 0.0–100.0)

## 2024-01-27 ENCOUNTER — Inpatient Hospital Stay: Attending: Oncology

## 2024-01-27 VITALS — BP 99/66 | HR 86 | Temp 97.1°F | Resp 18

## 2024-01-27 DIAGNOSIS — D508 Other iron deficiency anemias: Secondary | ICD-10-CM

## 2024-01-27 DIAGNOSIS — D500 Iron deficiency anemia secondary to blood loss (chronic): Secondary | ICD-10-CM | POA: Insufficient documentation

## 2024-01-27 DIAGNOSIS — N920 Excessive and frequent menstruation with regular cycle: Secondary | ICD-10-CM | POA: Insufficient documentation

## 2024-01-27 DIAGNOSIS — Z9884 Bariatric surgery status: Secondary | ICD-10-CM | POA: Diagnosis not present

## 2024-01-27 MED ORDER — ACETAMINOPHEN 325 MG PO TABS
650.0000 mg | ORAL_TABLET | Freq: Once | ORAL | Status: AC
  Administered 2024-01-27: 650 mg via ORAL
  Filled 2024-01-27: qty 2

## 2024-01-27 MED ORDER — SODIUM CHLORIDE 0.9 % IV SOLN
510.0000 mg | Freq: Once | INTRAVENOUS | Status: AC
  Administered 2024-01-27: 510 mg via INTRAVENOUS
  Filled 2024-01-27: qty 510
  Filled 2024-01-27: qty 17

## 2024-01-27 MED ORDER — CETIRIZINE HCL 10 MG PO TABS
10.0000 mg | ORAL_TABLET | Freq: Once | ORAL | Status: AC
  Administered 2024-01-27: 10 mg via ORAL
  Filled 2024-01-27: qty 1

## 2024-01-27 MED ORDER — SODIUM CHLORIDE 0.9 % IV SOLN: Freq: Once | INTRAVENOUS | Status: AC

## 2024-01-27 NOTE — Progress Notes (Signed)
 Patient tolerated iron infusion with no complaints voiced.  Peripheral IV site clean and dry with good blood return noted before and after infusion. Pt observed for 30 minutes post iron without any complications.  VSS with discharge and left in satisfactory condition with no s/s of distress noted. All follow ups as scheduled.   Atwood Adcock

## 2024-01-27 NOTE — Patient Instructions (Signed)

## 2024-02-03 ENCOUNTER — Inpatient Hospital Stay

## 2024-02-03 ENCOUNTER — Inpatient Hospital Stay (HOSPITAL_BASED_OUTPATIENT_CLINIC_OR_DEPARTMENT_OTHER): Admitting: Oncology

## 2024-02-03 VITALS — BP 105/65 | HR 75 | Temp 98.2°F | Resp 18 | Wt 184.0 lb

## 2024-02-03 DIAGNOSIS — N920 Excessive and frequent menstruation with regular cycle: Secondary | ICD-10-CM | POA: Diagnosis not present

## 2024-02-03 DIAGNOSIS — D508 Other iron deficiency anemias: Secondary | ICD-10-CM

## 2024-02-03 DIAGNOSIS — D500 Iron deficiency anemia secondary to blood loss (chronic): Secondary | ICD-10-CM | POA: Diagnosis not present

## 2024-02-03 MED ORDER — SODIUM CHLORIDE 0.9 % IV SOLN: INTRAVENOUS | Status: DC

## 2024-02-03 MED ORDER — SODIUM CHLORIDE 0.9% FLUSH: 10.0000 mL | Freq: Two times a day (BID) | INTRAVENOUS | Status: DC

## 2024-02-03 MED ORDER — CETIRIZINE HCL 10 MG PO TABS
10.0000 mg | ORAL_TABLET | Freq: Once | ORAL | Status: AC
  Administered 2024-02-03: 10 mg via ORAL
  Filled 2024-02-03: qty 1

## 2024-02-03 MED ORDER — SODIUM CHLORIDE 0.9 % IV SOLN
510.0000 mg | Freq: Once | INTRAVENOUS | Status: AC
  Administered 2024-02-03: 510 mg via INTRAVENOUS
  Filled 2024-02-03: qty 510

## 2024-02-03 MED ORDER — ACETAMINOPHEN 325 MG PO TABS
650.0000 mg | ORAL_TABLET | Freq: Once | ORAL | Status: AC
  Administered 2024-02-03: 650 mg via ORAL
  Filled 2024-02-03: qty 2

## 2024-02-03 NOTE — Progress Notes (Signed)
 Patient presents today for iron infusion.  Patient is in satisfactory condition with no new complaints voiced.  Vital signs are stable.  We will proceed with infusion per provider orders.    Peripheral IV started with good blood return pre and post infusion.  Treatment given today per MD orders. Tolerated infusion without adverse affects. Vital signs stable. No complaints at this time. Discharged from clinic ambulatory in stable condition. Alert and oriented x 3. F/U with [facility] as scheduled.

## 2024-02-03 NOTE — Progress Notes (Signed)
 [facility] OFFICE PROGRESS NOTE  Cook, Jayce G, DO  ASSESSMENT & PLAN:    Assessment & Plan Iron deficiency anemia due to chronic blood loss History of duodenal switch/gastric bypass surgery with inconsistent iron supplementation and also reports menorrhagia. Recent labs show low iron levels prior PCPs office. Labs from 01/21/2024 show ferritin of 8, iron saturation of 4% and hemoglobin 6.7. She received 1 dose of iron on 01/27/2024 and she is receiving a second dose today. Recommend 1 additional dose of IV Feraheme next week and follow-up in 3 weeks for labs and telephone call.  -Continue oral iron supplementation every other day  -Follow-up with GYN for menorrhagia and GI for screening colonoscopy -Follow-up in 3 to 4 weeks for labs and telephone call. Menorrhagia with regular cycle Reports of heavy bleeding during menstrual cycle, particularly on days 2 and 3. No recent consultation with OBGYN. Hormonal treatment with Prometrium was ineffective. Discussed with PCP possible ablation vs. hysterectomy. She was recently started on Megace and instructed to follow-up with GYN.  This has significantly improved her bleeding.  -Plan to discuss heavy bleeding with OBGYN during next visit.  Orders Placed This Encounter  Procedures   Iron and TIBC    Standing Status:   Future    Expected Date:   03/02/2024    Expiration Date:   05/31/2024    Release to patient:   Immediate [1]   CBC with Differential/Platelet    Standing Status:   Future    Expected Date:   03/02/2024    Expiration Date:   05/31/2024   Comprehensive metabolic panel    Standing Status:   Future    Expected Date:   03/02/2024    Expiration Date:   05/31/2024   Vitamin B12    Standing Status:   Future    Expected Date:   03/02/2024    Expiration Date:   05/31/2024   Folate    Standing Status:   Future    Expected Date:   03/02/2024    Expiration Date:   05/31/2024   Ferritin    Standing Status:    Future    Expected Date:   03/02/2024    Expiration Date:   05/31/2024    INTERVAL HISTORY: Patient returns for follow-up for iron deficiency anemia secondary to heavy menstrual cycles.  She last received iron on 01/27/2024 with good tolerance.  Prior to this infusion, it had been almost 1 year.  She reports she is taking iron supplements every other day along with B12 supplements.  She has not had a colonoscopy but plans to get this scheduled in the new year.  She was sent over by her PCPs office for a hemoglobin of 6.7, ferritin 8 iron saturation of 4%.  Reports she recently started Megace for heavy menstrual cycles which has helped with her bleeding.  She has not been seen by GYN.  Reports she has had a lifelong battle with iron deficiency and its been even worse since she had gastric bypass surgery.  Since the IV iron last week, she is feeling a little better reports increased energy and less leg swelling.  She has occasional shortness of breath and fatigue.  She has to stop and take deep breaths at times.  Vaginal bleeding has improved since she started Megace on 01/26/2024.  She was referred to GYN for an ablation.  We reviewed CBC, CMP, iron panel  SUMMARY OF HEMATOLOGIC HISTORY: Oncology History   No history  exists.     CBC    Component Value Date/Time   WBC 7.2 01/21/2024 1634   WBC 7.4 02/10/2023 1046   RBC 2.35 (LL) 01/21/2024 1634   RBC 4.87 02/10/2023 1046   HGB 6.7 (LL) 01/21/2024 1634   HCT 21.1 (L) 01/21/2024 1634   PLT 419 01/21/2024 1634   MCV 90 01/21/2024 1634   MCH 28.5 01/21/2024 1634   MCH 30.2 02/10/2023 1046   MCHC 31.8 01/21/2024 1634   MCHC 33.0 02/10/2023 1046   RDW 13.1 01/21/2024 1634   LYMPHSABS 1.7 01/21/2024 1634   MONOABS 0.5 02/10/2023 1046   EOSABS 0.2 01/21/2024 1634   BASOSABS 0.0 01/21/2024 1634       Latest Ref Rng & Units 01/21/2024    4:34 PM 04/17/2023    3:13 PM 09/21/2016    5:52 PM  CMP  Glucose 70 - 99 mg/dL 84   824   BUN 6 -  24 mg/dL 9   10   Creatinine 9.42 - 1.00 mg/dL 9.23   8.88   Sodium 865 - 144 mmol/L 142   143   Potassium 3.5 - 5.2 mmol/L 4.1   3.6   Chloride 96 - 106 mmol/L 106   112   CO2 20 - 29 mmol/L 21   20   Calcium 8.7 - 10.2 mg/dL 8.3   9.5   Total Protein 6.0 - 8.5 g/dL 5.6  6.5  6.9   Total Bilirubin 0.0 - 1.2 mg/dL <9.7  <9.7  0.9   Alkaline Phos 41 - 116 IU/L 111  137  78   AST 0 - 40 IU/L 12  33  21   ALT 0 - 32 IU/L 11  59  24      Lab Results  Component Value Date   FERRITIN 8 (L) 01/21/2024   VITAMINB12 593 02/10/2023    There were no vitals filed for this visit.  Review of System:  Review of Systems  Constitutional:  Positive for malaise/fatigue.  Respiratory:  Positive for shortness of breath.   Cardiovascular:  Positive for leg swelling.  Gastrointestinal:  Negative for blood in stool.  Genitourinary:        Heavy vaginal bleeding.  Neurological:  Negative for dizziness.    Physical Exam: Physical Exam Constitutional:      Appearance: Normal appearance.  HENT:     Head: Normocephalic and atraumatic.  Eyes:     Pupils: Pupils are equal, round, and reactive to light.  Cardiovascular:     Rate and Rhythm: Normal rate and regular rhythm.     Heart sounds: Normal heart sounds. No murmur heard. Pulmonary:     Effort: Pulmonary effort is normal.     Breath sounds: Normal breath sounds. No wheezing.  Abdominal:     General: Bowel sounds are normal. There is no distension.     Palpations: Abdomen is soft.     Tenderness: There is no abdominal tenderness.  Musculoskeletal:        General: Normal range of motion.     Cervical back: Normal range of motion.  Skin:    General: Skin is warm and dry.     Findings: No rash.  Neurological:     Mental Status: She is alert and oriented to person, place, and time.     Gait: Gait is intact.  Psychiatric:        Mood and Affect: Mood and affect normal.  Cognition and Memory: Memory normal.        Judgment:  Judgment normal.      I spent 20 minutes dedicated to the care of this patient (face-to-face and non-face-to-face) on the date of the encounter to include what is described in the assessment and plan.,  Delon Hope, NP 02/03/2024 10:03 AM

## 2024-02-03 NOTE — Assessment & Plan Note (Addendum)
 Reports of heavy bleeding during menstrual cycle, particularly on days 2 and 3. No recent consultation with OBGYN. Hormonal treatment with Prometrium was ineffective. Discussed with PCP possible ablation vs. hysterectomy. She was recently started on Megace and instructed to follow-up with GYN.  This has significantly improved her bleeding.  -Plan to discuss heavy bleeding with OBGYN during next visit.

## 2024-02-03 NOTE — Assessment & Plan Note (Addendum)
 History of duodenal switch/gastric bypass surgery with inconsistent iron supplementation and also reports menorrhagia. Recent labs show low iron levels prior PCPs office. Labs from 01/21/2024 show ferritin of 8, iron saturation of 4% and hemoglobin 6.7. She received 1 dose of iron on 01/27/2024 and she is receiving a second dose today. Recommend 1 additional dose of IV Feraheme next week and follow-up in 3 weeks for labs and telephone call.  -Continue oral iron supplementation every other day  -Follow-up with GYN for menorrhagia and GI for screening colonoscopy -Follow-up in 3 to 4 weeks for labs and telephone call.

## 2024-02-03 NOTE — Patient Instructions (Signed)

## 2024-02-16 ENCOUNTER — Inpatient Hospital Stay

## 2024-02-17 ENCOUNTER — Inpatient Hospital Stay: Attending: Oncology

## 2024-02-17 VITALS — BP 106/69 | HR 81 | Temp 97.9°F | Resp 18

## 2024-02-17 DIAGNOSIS — N920 Excessive and frequent menstruation with regular cycle: Secondary | ICD-10-CM | POA: Diagnosis present

## 2024-02-17 DIAGNOSIS — E538 Deficiency of other specified B group vitamins: Secondary | ICD-10-CM | POA: Insufficient documentation

## 2024-02-17 DIAGNOSIS — D500 Iron deficiency anemia secondary to blood loss (chronic): Secondary | ICD-10-CM | POA: Diagnosis present

## 2024-02-17 DIAGNOSIS — D508 Other iron deficiency anemias: Secondary | ICD-10-CM

## 2024-02-17 MED ORDER — SODIUM CHLORIDE 0.9 % IV SOLN
510.0000 mg | Freq: Once | INTRAVENOUS | Status: AC
  Administered 2024-02-17: 510 mg via INTRAVENOUS
  Filled 2024-02-17: qty 510

## 2024-02-17 MED ORDER — SODIUM CHLORIDE 0.9 % IV SOLN: INTRAVENOUS | Status: DC

## 2024-02-17 NOTE — Progress Notes (Signed)
 Patient presents today for iron infusion.  Patient is in satisfactory condition with no new complaints voiced.  Vital signs are stable.  We will proceed with infusion per provider orders.    Peripheral IV started with good blood return pre and post infusion. Patient took pre-meds at home prior to arrival.  Feraheme  510 mg given today per MD orders. Tolerated infusion without adverse affects. Vital signs stable. No complaints at this time. Discharged from clinic ambulatory in stable condition. Alert and oriented x 3. F/U with [facility] as scheduled.

## 2024-02-17 NOTE — Patient Instructions (Signed)

## 2024-02-25 ENCOUNTER — Encounter: Payer: Self-pay | Admitting: Nurse Practitioner

## 2024-03-03 DIAGNOSIS — N920 Excessive and frequent menstruation with regular cycle: Secondary | ICD-10-CM | POA: Diagnosis not present

## 2024-03-07 ENCOUNTER — Inpatient Hospital Stay

## 2024-03-07 DIAGNOSIS — N920 Excessive and frequent menstruation with regular cycle: Secondary | ICD-10-CM

## 2024-03-07 DIAGNOSIS — D500 Iron deficiency anemia secondary to blood loss (chronic): Secondary | ICD-10-CM | POA: Diagnosis not present

## 2024-03-07 LAB — CBC WITH DIFFERENTIAL/PLATELET
Abs Immature Granulocytes: 0.01 K/uL (ref 0.00–0.07)
Basophils Absolute: 0 K/uL (ref 0.0–0.1)
Basophils Relative: 1 %
Eosinophils Absolute: 0.2 K/uL (ref 0.0–0.5)
Eosinophils Relative: 3 %
HCT: 41.1 % (ref 36.0–46.0)
Hemoglobin: 13.7 g/dL (ref 12.0–15.0)
Immature Granulocytes: 0 %
Lymphocytes Relative: 30 %
Lymphs Abs: 1.7 K/uL (ref 0.7–4.0)
MCH: 30.3 pg (ref 26.0–34.0)
MCHC: 33.3 g/dL (ref 30.0–36.0)
MCV: 90.9 fL (ref 80.0–100.0)
Monocytes Absolute: 0.4 K/uL (ref 0.1–1.0)
Monocytes Relative: 7 %
Neutro Abs: 3.3 K/uL (ref 1.7–7.7)
Neutrophils Relative %: 59 %
Platelets: 300 K/uL (ref 150–400)
RBC: 4.52 MIL/uL (ref 3.87–5.11)
RDW: 14.6 % (ref 11.5–15.5)
WBC: 5.6 K/uL (ref 4.0–10.5)
nRBC: 0 % (ref 0.0–0.2)

## 2024-03-07 LAB — COMPREHENSIVE METABOLIC PANEL WITH GFR
ALT: 19 U/L (ref 0–44)
AST: 20 U/L (ref 15–41)
Albumin: 3.9 g/dL (ref 3.5–5.0)
Alkaline Phosphatase: 91 U/L (ref 38–126)
Anion gap: 9 (ref 5–15)
BUN: 9 mg/dL (ref 6–20)
CO2: 23 mmol/L (ref 22–32)
Calcium: 8.6 mg/dL — ABNORMAL LOW (ref 8.9–10.3)
Chloride: 109 mmol/L (ref 98–111)
Creatinine, Ser: 0.86 mg/dL (ref 0.44–1.00)
GFR, Estimated: >60 mL/min
Glucose, Bld: 106 mg/dL — ABNORMAL HIGH (ref 70–99)
Potassium: 3.4 mmol/L — ABNORMAL LOW (ref 3.5–5.1)
Sodium: 141 mmol/L (ref 135–145)
Total Bilirubin: <0.2 mg/dL (ref 0.0–1.2)
Total Protein: 6.4 g/dL — ABNORMAL LOW (ref 6.5–8.1)

## 2024-03-07 LAB — IRON AND TIBC
Iron: 73 ug/dL (ref 28–170)
Saturation Ratios: 23 % (ref 10.4–31.8)
TIBC: 325 ug/dL (ref 250–450)
UIBC: 252 ug/dL

## 2024-03-07 LAB — FOLATE: Folate: 5.5 ng/mL — ABNORMAL LOW

## 2024-03-07 LAB — VITAMIN B12: Vitamin B-12: 243 pg/mL (ref 180–914)

## 2024-03-07 LAB — FERRITIN: Ferritin: 137 ng/mL (ref 11–307)

## 2024-03-14 ENCOUNTER — Encounter: Payer: Self-pay | Admitting: *Deleted

## 2024-03-15 ENCOUNTER — Inpatient Hospital Stay: Admitting: Oncology

## 2024-03-15 ENCOUNTER — Ambulatory Visit: Payer: Self-pay | Admitting: Oncology

## 2024-03-15 DIAGNOSIS — D508 Other iron deficiency anemias: Secondary | ICD-10-CM | POA: Diagnosis not present

## 2024-03-15 DIAGNOSIS — E538 Deficiency of other specified B group vitamins: Secondary | ICD-10-CM

## 2024-03-15 NOTE — Progress Notes (Signed)
 This this patient have scheduled follow-up with us ?  She needs to start taking folic acid 1 mg daily along with B12 supplements 1000 mcg/day.  Her hemoglobin is significantly better from previous and iron levels look good.  Delon Hope, AGNP-C Department of Hematology/Oncology [facility] at [facility]  Phone: [phone]  03/15/2024 8:02 AM

## 2024-03-15 NOTE — Assessment & Plan Note (Addendum)
 B12 levels are less than 400. Recommend starting 1000 mcg B12 daily. If no improvement in 3 months, recommend B12 shots. Recheck levels in 3 months.

## 2024-03-15 NOTE — Assessment & Plan Note (Addendum)
 History of duodenal switch/gastric bypass surgery with inconsistent iron supplementation and also reports menorrhagia. Recent labs show low iron levels prior PCPs office. She is status post uterine ablation about 2 weeks ago with Dr. Tomblin. She continues to bleed vaginally but much less than before.  Going through 5-6 pads daily. She has follow-up with Dr. Tomblin on 03/23/2024. Labs from 03/07/2024 show iron saturation 23% with normal TIBC.  Hemoglobin is 13.7 (6.7).  She received 3 doses of IV iron on 01/27/2024, 02/03/2024 and 02/17/2024.  -Continue oral iron supplementation every other day with vitamin C -No additional IV iron needed at this time. - Follow-up in 10 weeks with labs and telephone call.

## 2024-03-15 NOTE — Progress Notes (Signed)
 "  [name] OFFICE PROGRESS NOTE  Cook, Jayce G, DO  ASSESSMENT & PLAN:   I connected with [name] on 03/15/2024 at  9:45 AM EST by telephone visit and verified that I am speaking with the correct person using two identifiers.   I discussed the limitations, risks, security and privacy concerns of performing an evaluation and management service by telemedicine and the availability of in-person appointments. I also discussed with the patient that there may be a patient responsible charge related to this service. The patient expressed understanding and agreed to proceed.   Other persons participating in the visit and their role in the encounter: NP, patient    Patients location: Home  Providers location: Clinic   Assessment & Plan Other iron deficiency anemia History of duodenal switch/gastric bypass surgery with inconsistent iron supplementation and also reports menorrhagia. Recent labs show low iron levels prior PCPs office. She is status post uterine ablation about 2 weeks ago with Dr. Tomblin. She continues to bleed vaginally but much less than before.  Going through 5-6 pads daily. She has follow-up with Dr. Tomblin on 03/23/2024. Labs from 03/07/2024 show iron saturation 23% with normal TIBC.  Hemoglobin is 13.7 (6.7).  She received 3 doses of IV iron on 01/27/2024, 02/03/2024 and 02/17/2024.  -Continue oral iron supplementation every other day with vitamin C -No additional IV iron needed at this time. - Follow-up in 10 weeks with labs and telephone call. Folate deficiency Folate level was found to be low. Recommend starting folic acid 1 mg daily and we will recheck labs in 3 months. Vitamin B12 deficiency disease B12 levels are less than 400. Recommend starting 1000 mcg B12 daily. If no improvement in 3 months, recommend B12 shots. Recheck levels in 3 months.  Orders Placed This Encounter  Procedures   Iron and TIBC    Standing Status:   Future     Expected Date:   06/13/2024    Expiration Date:   09/11/2024    Release to patient:   Immediate [1]   CBC with Differential/Platelet    Standing Status:   Future    Expected Date:   06/13/2024    Expiration Date:   09/11/2024   Comprehensive metabolic panel    Standing Status:   Future    Expected Date:   06/13/2024    Expiration Date:   09/11/2024   Vitamin B12    Standing Status:   Future    Expected Date:   06/13/2024    Expiration Date:   09/11/2024   Folate    Standing Status:   Future    Expected Date:   06/13/2024    Expiration Date:   09/11/2024   Ferritin    Standing Status:   Future    Expected Date:   06/13/2024    Expiration Date:   09/11/2024    INTERVAL HISTORY: Patient returns for follow-up for iron deficiency anemia secondary to heavy menstrual cycles.  She received 3 doses of IV Feraheme  on 01/27/2024, 02/03/2024 and 02/17/2024. She reports she is taking iron supplements every other day along with B12 supplements.  She has not had a colonoscopy but plans to get this scheduled in the new year.  She was sent over by her PCPs office for a hemoglobin of 6.7, ferritin 8 iron saturation of 4%.  Reports she recently started Megace  for heavy menstrual cycles which has helped with her bleeding.    She is status post uterine  ablation with Dr. Tomblin on 03/03/2024.  Reports significant decrease in vaginal bleeding. She has follow-up next week.  Reports she still has vaginal bleeding but is going through less pads.  No abdominal pain.  Reports she has had a lifelong battle with iron deficiency and its been even worse since she had gastric bypass surgery.  She has occasional shortness of breath and fatigue.  She has to stop and take deep breaths at times.   She is currently taking iron supplements when she remembers.  She tries to take them every few days.  We reviewed CBC, CMP, iron panel, ferritin, b12, folate.   SUMMARY OF HEMATOLOGIC HISTORY: Oncology History   No problem  history exists.     CBC    Component Value Date/Time   WBC 5.6 03/07/2024 0928   RBC 4.52 03/07/2024 0928   HGB 13.7 03/07/2024 0928   HGB 6.7 (LL) 01/21/2024 1634   HCT 41.1 03/07/2024 0928   HCT 21.1 (L) 01/21/2024 1634   PLT 300 03/07/2024 0928   PLT 419 01/21/2024 1634   MCV 90.9 03/07/2024 0928   MCV 90 01/21/2024 1634   MCH 30.3 03/07/2024 0928   MCHC 33.3 03/07/2024 0928   RDW 14.6 03/07/2024 0928   RDW 13.1 01/21/2024 1634   LYMPHSABS 1.7 03/07/2024 0928   LYMPHSABS 1.7 01/21/2024 1634   MONOABS 0.4 03/07/2024 0928   EOSABS 0.2 03/07/2024 0928   EOSABS 0.2 01/21/2024 1634   BASOSABS 0.0 03/07/2024 0928   BASOSABS 0.0 01/21/2024 1634       Latest Ref Rng & Units 03/07/2024    9:28 AM 01/21/2024    4:34 PM 04/17/2023    3:13 PM  CMP  Glucose 70 - 99 mg/dL 893  84    BUN 6 - 20 mg/dL 9  9    Creatinine 9.55 - 1.00 mg/dL 9.13  9.23    Sodium 864 - 145 mmol/L 141  142    Potassium 3.5 - 5.1 mmol/L 3.4  4.1    Chloride 98 - 111 mmol/L 109  106    CO2 22 - 32 mmol/L 23  21    Calcium 8.9 - 10.3 mg/dL 8.6  8.3    Total Protein 6.5 - 8.1 g/dL 6.4  5.6  6.5   Total Bilirubin 0.0 - 1.2 mg/dL <9.7  <9.7  <9.7   Alkaline Phos 38 - 126 U/L 91  111  137   AST 15 - 41 U/L 20  12  33   ALT 0 - 44 U/L 19  11  59      Lab Results  Component Value Date   FERRITIN 137 03/07/2024   VITAMINB12 243 03/07/2024    There were no vitals filed for this visit.  Review of System:  Review of Systems  Constitutional:  Positive for malaise/fatigue.    Physical Exam: Physical Exam Neurological:     Mental Status: She is alert and oriented to person, place, and time.      I provided 25 minutes of non face-to-face telephone visit time during this encounter, and > 50% was spent counseling as documented under my assessment & plan.  Delon Hope, NP 03/15/2024 9:58 AM "

## 2024-03-15 NOTE — Assessment & Plan Note (Addendum)
 Folate level was found to be low. Recommend starting folic acid 1 mg daily and we will recheck labs in 3 months.

## 2024-06-02 ENCOUNTER — Inpatient Hospital Stay

## 2024-06-09 ENCOUNTER — Inpatient Hospital Stay: Admitting: Oncology
